# Patient Record
Sex: Female | Born: 1935 | Race: White | Hispanic: No | Smoking: Former smoker
Health system: Southern US, Community
[De-identification: ages and names within clinical notes are randomized; demographics above are authoritative.]

## PROBLEM LIST (undated history)

## (undated) DIAGNOSIS — M810 Age-related osteoporosis without current pathological fracture: Secondary | ICD-10-CM

## (undated) DIAGNOSIS — Z23 Encounter for immunization: Secondary | ICD-10-CM

## (undated) DIAGNOSIS — F0390 Unspecified dementia without behavioral disturbance: Secondary | ICD-10-CM

## (undated) DIAGNOSIS — K219 Gastro-esophageal reflux disease without esophagitis: Secondary | ICD-10-CM

## (undated) DIAGNOSIS — E559 Vitamin D deficiency, unspecified: Secondary | ICD-10-CM

## (undated) DIAGNOSIS — F329 Major depressive disorder, single episode, unspecified: Secondary | ICD-10-CM

## (undated) DIAGNOSIS — C50919 Malignant neoplasm of unspecified site of unspecified female breast: Secondary | ICD-10-CM

## (undated) DIAGNOSIS — F32A Depression, unspecified: Secondary | ICD-10-CM

## (undated) DIAGNOSIS — F419 Anxiety disorder, unspecified: Secondary | ICD-10-CM

## (undated) DIAGNOSIS — J189 Pneumonia, unspecified organism: Secondary | ICD-10-CM

## (undated) DIAGNOSIS — E7800 Pure hypercholesterolemia, unspecified: Secondary | ICD-10-CM

## (undated) DIAGNOSIS — I10 Essential (primary) hypertension: Secondary | ICD-10-CM

## (undated) DIAGNOSIS — K449 Diaphragmatic hernia without obstruction or gangrene: Secondary | ICD-10-CM

## (undated) DIAGNOSIS — J449 Chronic obstructive pulmonary disease, unspecified: Secondary | ICD-10-CM

## (undated) DIAGNOSIS — R7303 Prediabetes: Secondary | ICD-10-CM

## (undated) HISTORY — PX: OTHER SURGICAL HISTORY: SHX169

## (undated) HISTORY — DX: Encounter for immunization: Z23

## (undated) HISTORY — DX: Chronic obstructive pulmonary disease, unspecified: J44.9

## (undated) HISTORY — DX: Gastro-esophageal reflux disease without esophagitis: K21.9

## (undated) HISTORY — DX: Depression, unspecified: F32.A

## (undated) HISTORY — DX: Age-related osteoporosis without current pathological fracture: M81.0

## (undated) HISTORY — DX: Malignant neoplasm of unspecified site of unspecified female breast: C50.919

## (undated) HISTORY — PX: REPAIR ZENKER'S DIVERTICULA: SUR1212

## (undated) HISTORY — DX: Pneumonia, unspecified organism: J18.9

## (undated) HISTORY — DX: Vitamin D deficiency, unspecified: E55.9

## (undated) HISTORY — DX: Diaphragmatic hernia without obstruction or gangrene: K44.9

## (undated) HISTORY — PX: THROAT SURGERY: SHX803

## (undated) HISTORY — PX: APPENDECTOMY: SHX54

## (undated) HISTORY — DX: Essential (primary) hypertension: I10

## (undated) HISTORY — DX: Pure hypercholesterolemia, unspecified: E78.00

## (undated) HISTORY — DX: Major depressive disorder, single episode, unspecified: F32.9

## (undated) HISTORY — DX: Prediabetes: R73.03

## (undated) HISTORY — DX: Anxiety disorder, unspecified: F41.9

---

## 1998-01-01 ENCOUNTER — Other Ambulatory Visit: Admission: RE | Admit: 1998-01-01 | Discharge: 1998-01-01 | Payer: Self-pay | Admitting: *Deleted

## 1998-04-12 ENCOUNTER — Other Ambulatory Visit: Admission: RE | Admit: 1998-04-12 | Discharge: 1998-04-12 | Payer: Self-pay | Admitting: *Deleted

## 1998-07-05 ENCOUNTER — Ambulatory Visit (HOSPITAL_COMMUNITY): Admission: RE | Admit: 1998-07-05 | Discharge: 1998-07-05 | Payer: Self-pay | Admitting: *Deleted

## 1998-10-05 ENCOUNTER — Ambulatory Visit (HOSPITAL_COMMUNITY): Admission: RE | Admit: 1998-10-05 | Discharge: 1998-10-05 | Payer: Self-pay | Admitting: *Deleted

## 1998-10-22 ENCOUNTER — Encounter: Payer: Self-pay | Admitting: Internal Medicine

## 1998-10-22 ENCOUNTER — Inpatient Hospital Stay (HOSPITAL_COMMUNITY): Admission: EM | Admit: 1998-10-22 | Discharge: 1998-10-26 | Payer: Self-pay | Admitting: Internal Medicine

## 1999-03-21 ENCOUNTER — Encounter: Payer: Self-pay | Admitting: *Deleted

## 1999-03-22 ENCOUNTER — Inpatient Hospital Stay (HOSPITAL_COMMUNITY): Admission: RE | Admit: 1999-03-22 | Discharge: 1999-03-24 | Payer: Self-pay | Admitting: *Deleted

## 1999-04-17 ENCOUNTER — Encounter: Payer: Self-pay | Admitting: *Deleted

## 1999-04-17 ENCOUNTER — Ambulatory Visit (HOSPITAL_COMMUNITY): Admission: RE | Admit: 1999-04-17 | Discharge: 1999-04-17 | Payer: Self-pay | Admitting: *Deleted

## 1999-07-30 ENCOUNTER — Other Ambulatory Visit: Admission: RE | Admit: 1999-07-30 | Discharge: 1999-07-30 | Payer: Self-pay | Admitting: *Deleted

## 2000-02-07 ENCOUNTER — Ambulatory Visit (HOSPITAL_COMMUNITY): Admission: RE | Admit: 2000-02-07 | Discharge: 2000-02-07 | Payer: Self-pay | Admitting: *Deleted

## 2001-02-11 ENCOUNTER — Encounter: Payer: Self-pay | Admitting: Internal Medicine

## 2001-02-11 ENCOUNTER — Ambulatory Visit (HOSPITAL_COMMUNITY): Admission: RE | Admit: 2001-02-11 | Discharge: 2001-02-11 | Payer: Self-pay | Admitting: Internal Medicine

## 2002-02-14 ENCOUNTER — Ambulatory Visit (HOSPITAL_COMMUNITY): Admission: RE | Admit: 2002-02-14 | Discharge: 2002-02-14 | Payer: Self-pay | Admitting: Internal Medicine

## 2002-02-14 ENCOUNTER — Encounter: Payer: Self-pay | Admitting: Internal Medicine

## 2002-02-22 ENCOUNTER — Encounter: Admission: RE | Admit: 2002-02-22 | Discharge: 2002-02-22 | Payer: Self-pay | Admitting: Internal Medicine

## 2002-02-22 ENCOUNTER — Encounter (INDEPENDENT_AMBULATORY_CARE_PROVIDER_SITE_OTHER): Payer: Self-pay | Admitting: *Deleted

## 2002-02-22 ENCOUNTER — Encounter: Payer: Self-pay | Admitting: Internal Medicine

## 2002-03-07 ENCOUNTER — Encounter (INDEPENDENT_AMBULATORY_CARE_PROVIDER_SITE_OTHER): Payer: Self-pay | Admitting: *Deleted

## 2002-03-07 ENCOUNTER — Encounter: Payer: Self-pay | Admitting: Surgery

## 2002-03-07 ENCOUNTER — Ambulatory Visit (HOSPITAL_BASED_OUTPATIENT_CLINIC_OR_DEPARTMENT_OTHER): Admission: RE | Admit: 2002-03-07 | Discharge: 2002-03-07 | Payer: Self-pay | Admitting: Surgery

## 2002-03-07 ENCOUNTER — Encounter: Admission: RE | Admit: 2002-03-07 | Discharge: 2002-03-07 | Payer: Self-pay | Admitting: Surgery

## 2002-03-15 ENCOUNTER — Ambulatory Visit: Admission: RE | Admit: 2002-03-15 | Discharge: 2002-06-13 | Payer: Self-pay | Admitting: Radiation Oncology

## 2002-06-28 ENCOUNTER — Ambulatory Visit: Admission: RE | Admit: 2002-06-28 | Discharge: 2002-06-28 | Payer: Self-pay | Admitting: Radiation Oncology

## 2002-12-28 ENCOUNTER — Encounter: Payer: Self-pay | Admitting: Internal Medicine

## 2002-12-28 ENCOUNTER — Ambulatory Visit (HOSPITAL_COMMUNITY): Admission: RE | Admit: 2002-12-28 | Discharge: 2002-12-28 | Payer: Self-pay | Admitting: Internal Medicine

## 2003-02-16 ENCOUNTER — Encounter: Admission: RE | Admit: 2003-02-16 | Discharge: 2003-02-16 | Payer: Self-pay | Admitting: Internal Medicine

## 2003-02-16 ENCOUNTER — Encounter: Payer: Self-pay | Admitting: Internal Medicine

## 2003-06-13 ENCOUNTER — Encounter: Payer: Self-pay | Admitting: Internal Medicine

## 2003-06-13 ENCOUNTER — Ambulatory Visit (HOSPITAL_COMMUNITY): Admission: RE | Admit: 2003-06-13 | Discharge: 2003-06-13 | Payer: Self-pay | Admitting: Internal Medicine

## 2003-07-28 ENCOUNTER — Ambulatory Visit (HOSPITAL_COMMUNITY): Admission: RE | Admit: 2003-07-28 | Discharge: 2003-07-28 | Payer: Self-pay | Admitting: Internal Medicine

## 2003-09-09 LAB — HM DEXA SCAN

## 2003-10-10 LAB — HM COLONOSCOPY: HM Colonoscopy: NEGATIVE

## 2004-02-19 ENCOUNTER — Encounter: Admission: RE | Admit: 2004-02-19 | Discharge: 2004-02-19 | Payer: Self-pay | Admitting: Oncology

## 2005-02-19 ENCOUNTER — Encounter: Admission: RE | Admit: 2005-02-19 | Discharge: 2005-02-19 | Payer: Self-pay | Admitting: Oncology

## 2005-03-27 ENCOUNTER — Ambulatory Visit (HOSPITAL_COMMUNITY): Admission: RE | Admit: 2005-03-27 | Discharge: 2005-03-27 | Payer: Self-pay | Admitting: Internal Medicine

## 2005-04-10 ENCOUNTER — Ambulatory Visit (HOSPITAL_COMMUNITY): Admission: RE | Admit: 2005-04-10 | Discharge: 2005-04-10 | Payer: Self-pay | Admitting: Internal Medicine

## 2005-06-24 ENCOUNTER — Ambulatory Visit: Payer: Self-pay | Admitting: Oncology

## 2006-02-20 ENCOUNTER — Encounter: Admission: RE | Admit: 2006-02-20 | Discharge: 2006-02-20 | Payer: Self-pay | Admitting: Oncology

## 2006-06-08 ENCOUNTER — Ambulatory Visit: Payer: Self-pay | Admitting: Oncology

## 2006-06-09 LAB — CBC WITH DIFFERENTIAL/PLATELET
BASO%: 0.5 % (ref 0.0–2.0)
Eosinophils Absolute: 0.3 10*3/uL (ref 0.0–0.5)
MONO#: 0.5 10*3/uL (ref 0.1–0.9)
NEUT#: 4.4 10*3/uL (ref 1.5–6.5)
RBC: 4.09 10*6/uL (ref 3.70–5.32)
RDW: 12.3 % (ref 11.3–14.5)
WBC: 7.4 10*3/uL (ref 3.9–10.0)
lymph#: 2.2 10*3/uL (ref 0.9–3.3)

## 2007-01-25 ENCOUNTER — Inpatient Hospital Stay (HOSPITAL_COMMUNITY): Admission: EM | Admit: 2007-01-25 | Discharge: 2007-01-30 | Payer: Self-pay | Admitting: *Deleted

## 2007-02-08 ENCOUNTER — Ambulatory Visit (HOSPITAL_COMMUNITY): Admission: RE | Admit: 2007-02-08 | Discharge: 2007-02-08 | Payer: Self-pay | Admitting: Internal Medicine

## 2007-03-03 ENCOUNTER — Encounter: Admission: RE | Admit: 2007-03-03 | Discharge: 2007-03-03 | Payer: Self-pay | Admitting: Oncology

## 2007-03-19 ENCOUNTER — Ambulatory Visit: Payer: Self-pay | Admitting: Internal Medicine

## 2007-04-13 ENCOUNTER — Encounter: Payer: Self-pay | Admitting: Family Medicine

## 2007-04-30 ENCOUNTER — Ambulatory Visit: Payer: Self-pay | Admitting: Internal Medicine

## 2007-06-16 ENCOUNTER — Ambulatory Visit: Payer: Self-pay | Admitting: Oncology

## 2007-06-21 LAB — CBC WITH DIFFERENTIAL/PLATELET
Eosinophils Absolute: 0.5 10*3/uL (ref 0.0–0.5)
HCT: 35.3 % (ref 34.8–46.6)
LYMPH%: 39.8 % (ref 14.0–48.0)
MONO#: 0.5 10*3/uL (ref 0.1–0.9)
NEUT#: 4 10*3/uL (ref 1.5–6.5)
NEUT%: 47.5 % (ref 39.6–76.8)
Platelets: 353 10*3/uL (ref 145–400)
WBC: 8.3 10*3/uL (ref 3.9–10.0)
lymph#: 3.3 10*3/uL (ref 0.9–3.3)

## 2007-07-02 ENCOUNTER — Ambulatory Visit: Payer: Self-pay

## 2008-03-03 ENCOUNTER — Encounter: Admission: RE | Admit: 2008-03-03 | Discharge: 2008-03-03 | Payer: Self-pay | Admitting: Internal Medicine

## 2008-08-30 ENCOUNTER — Encounter: Admission: RE | Admit: 2008-08-30 | Discharge: 2008-08-30 | Payer: Self-pay | Admitting: Internal Medicine

## 2008-12-05 IMAGING — CR DG CHEST 2V
2 series · 2 of 2 positions shown · non-contrast
Comparison: 01/25/07.

CLINICAL DATA: Cough.  Shortness of breath.  Asthma.  Recent pneumonia.
 CHEST - 2 VIEW:

[view not recorded (1 of 2)]
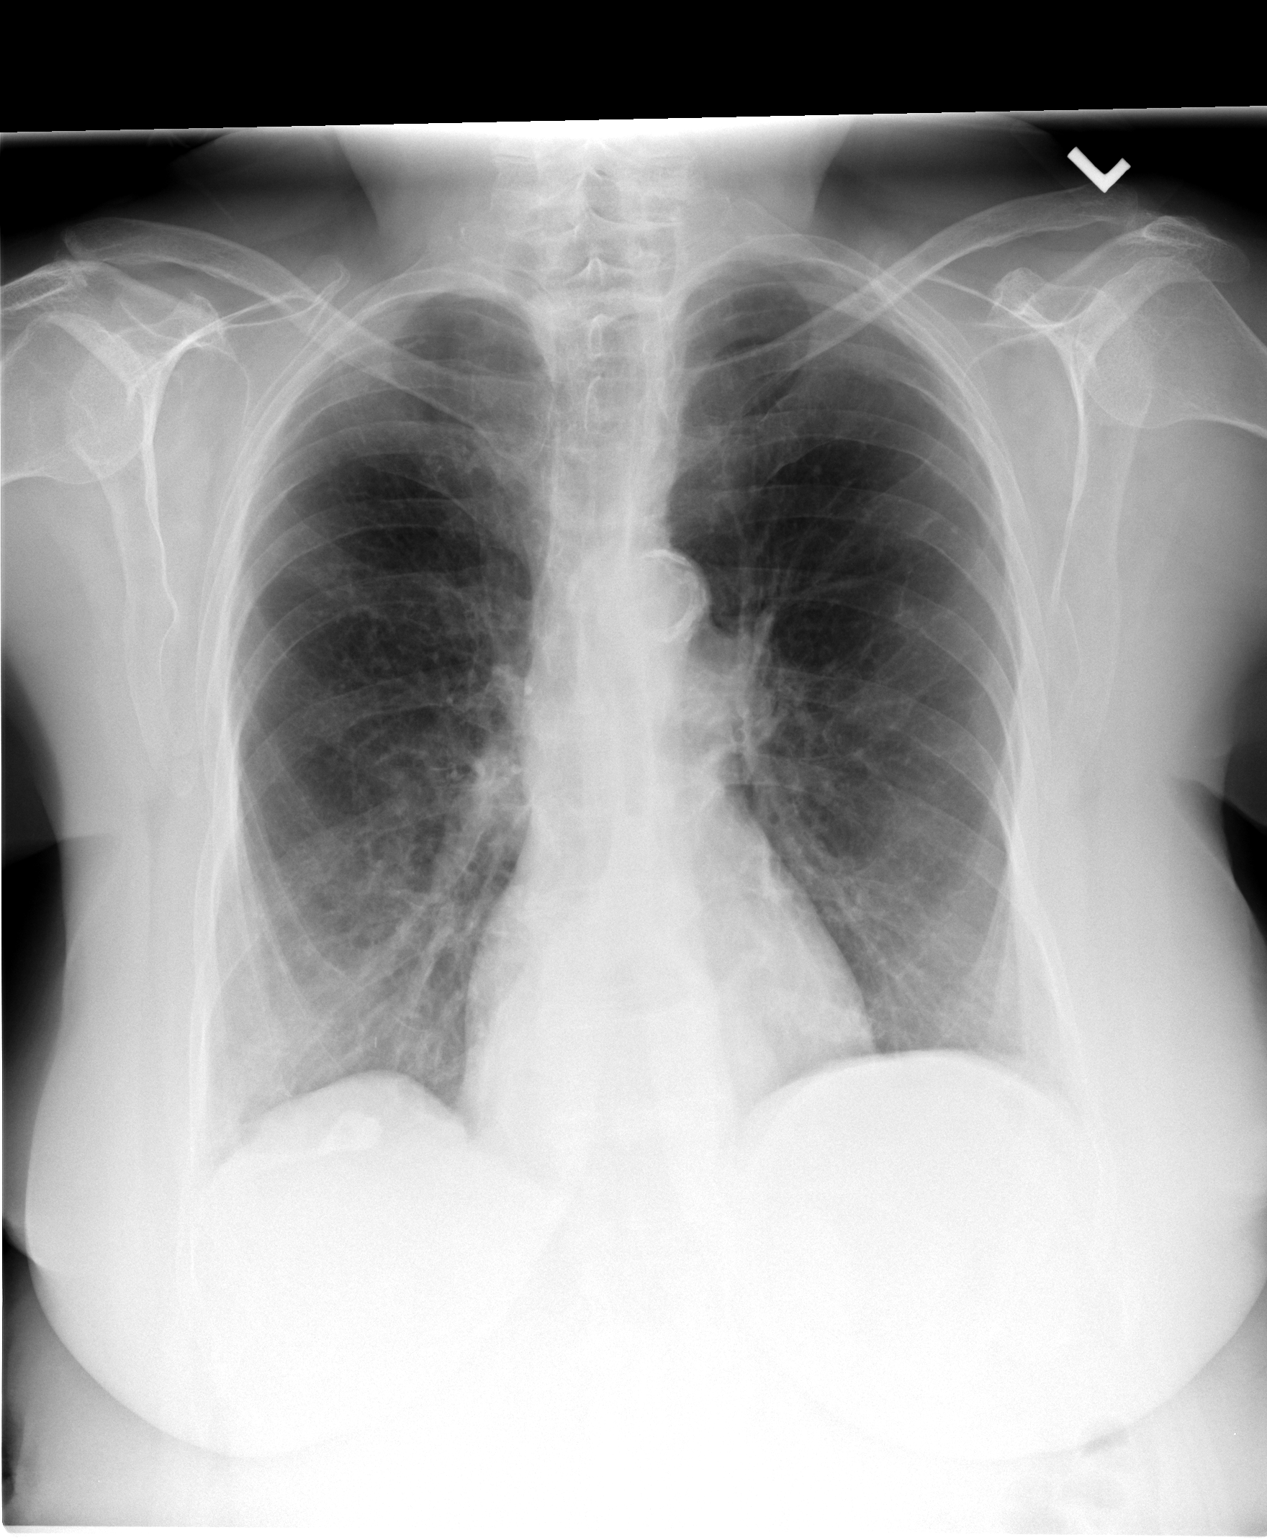

[view not recorded (2 of 2)]
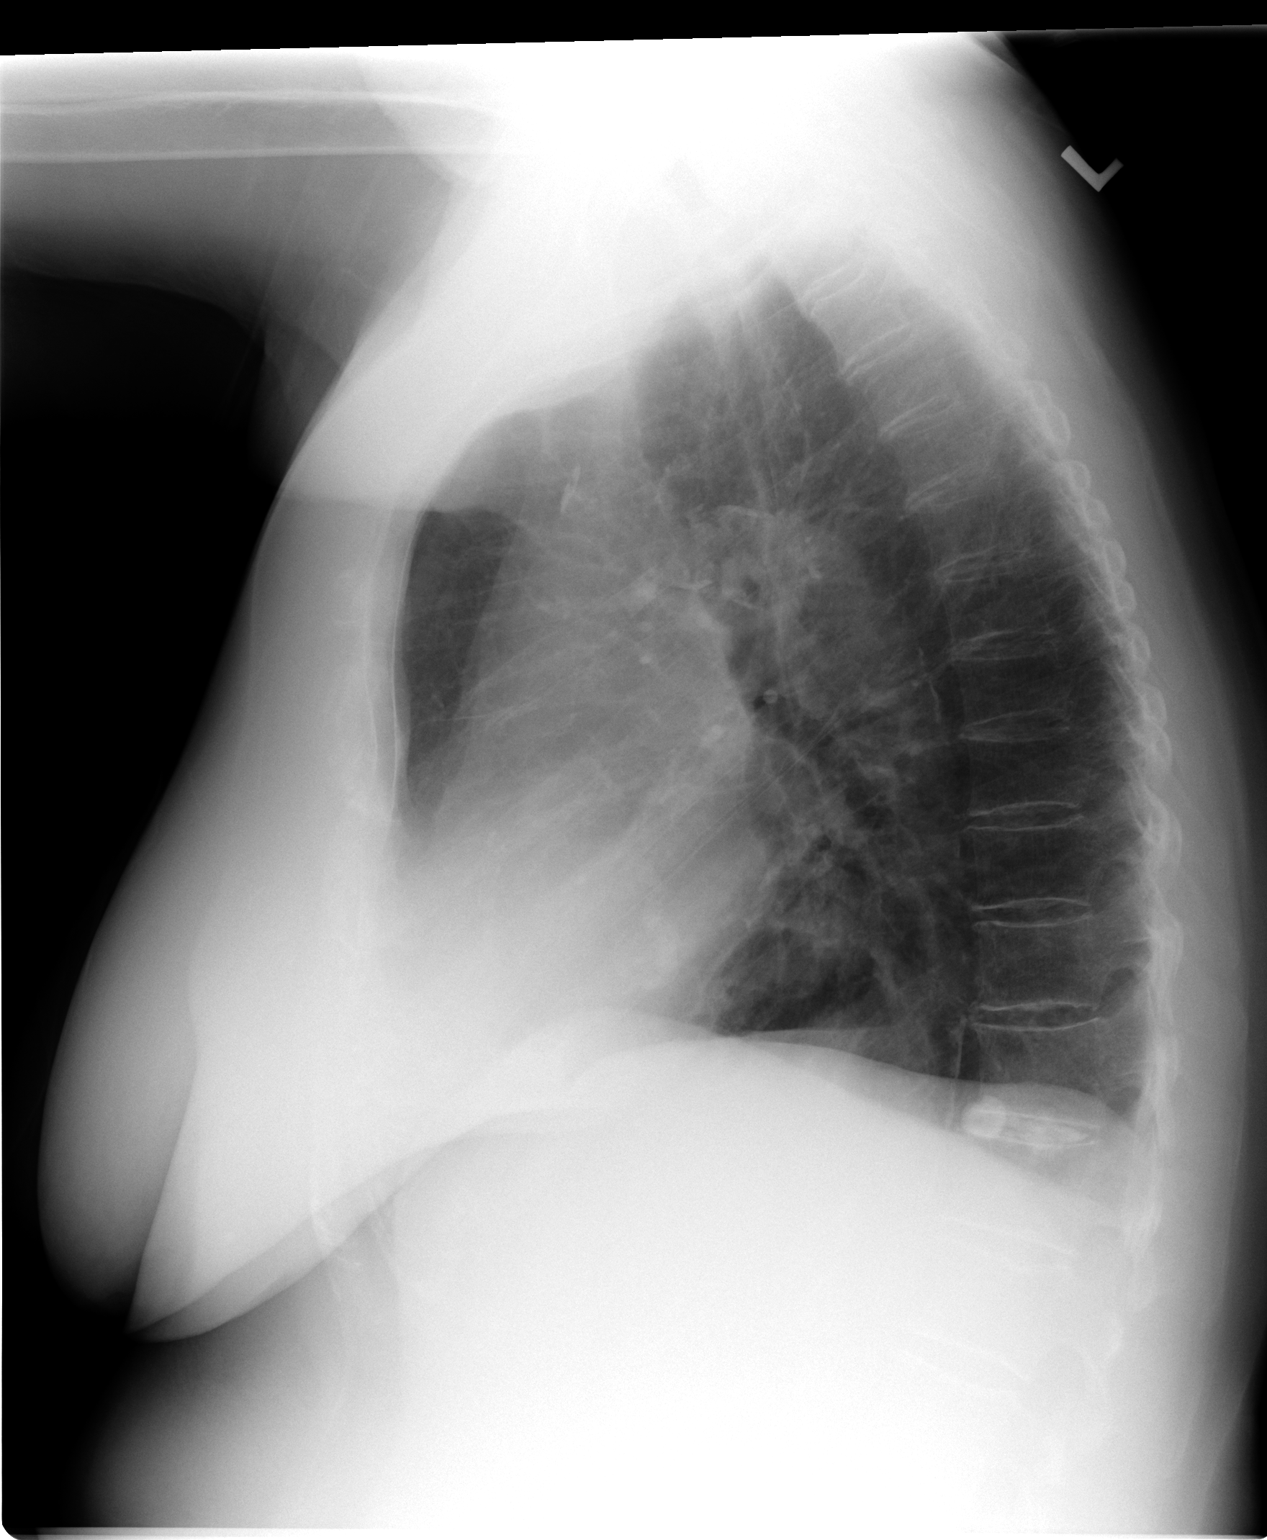

[2 of 2 positions shown; findings below may reference images not displayed]

FINDINGS: There has been near complete resolution of right lower lobe air space disease since the prior study.  There is no evidence of pleural effusion.  The heart size is normal.   A small to moderate hiatal hernia is again noted.
IMPRESSION: 1.  Near complete resolution of right lower lobe infiltrate since the prior study.  
 2.  Stable small to moderate hiatal hernia.

## 2009-05-28 ENCOUNTER — Encounter: Admission: RE | Admit: 2009-05-28 | Discharge: 2009-05-28 | Payer: Self-pay | Admitting: Internal Medicine

## 2009-09-24 ENCOUNTER — Ambulatory Visit (HOSPITAL_COMMUNITY): Admission: RE | Admit: 2009-09-24 | Discharge: 2009-09-24 | Payer: Self-pay | Admitting: Internal Medicine

## 2009-12-28 ENCOUNTER — Inpatient Hospital Stay (HOSPITAL_COMMUNITY): Admission: EM | Admit: 2009-12-28 | Discharge: 2009-12-31 | Payer: Self-pay | Admitting: Emergency Medicine

## 2009-12-31 ENCOUNTER — Encounter (INDEPENDENT_AMBULATORY_CARE_PROVIDER_SITE_OTHER): Payer: Self-pay | Admitting: Internal Medicine

## 2010-05-17 ENCOUNTER — Encounter: Admission: RE | Admit: 2010-05-17 | Discharge: 2010-05-17 | Payer: Self-pay | Admitting: Internal Medicine

## 2010-05-24 ENCOUNTER — Encounter: Admission: RE | Admit: 2010-05-24 | Discharge: 2010-05-24 | Payer: Self-pay | Admitting: Internal Medicine

## 2010-10-20 ENCOUNTER — Encounter: Payer: Self-pay | Admitting: Oncology

## 2010-11-04 ENCOUNTER — Other Ambulatory Visit: Payer: Self-pay | Admitting: Internal Medicine

## 2010-11-04 DIAGNOSIS — Z09 Encounter for follow-up examination after completed treatment for conditions other than malignant neoplasm: Secondary | ICD-10-CM

## 2010-11-13 ENCOUNTER — Ambulatory Visit
Admission: RE | Admit: 2010-11-13 | Discharge: 2010-11-13 | Disposition: A | Discharge status: Home / Self Care | Payer: Self-pay | Source: Ambulatory Visit | Attending: Internal Medicine | Admitting: Internal Medicine

## 2010-11-13 DIAGNOSIS — Z09 Encounter for follow-up examination after completed treatment for conditions other than malignant neoplasm: Secondary | ICD-10-CM

## 2010-11-17 ENCOUNTER — Inpatient Hospital Stay (HOSPITAL_COMMUNITY)
Admission: EM | Admit: 2010-11-17 | Discharge: 2010-11-19 | DRG: 190 | Disposition: A | Discharge status: Home / Self Care | Payer: Medicare Other | Attending: Internal Medicine | Admitting: Internal Medicine

## 2010-11-17 ENCOUNTER — Encounter: Payer: Self-pay | Admitting: Cardiology

## 2010-11-17 ENCOUNTER — Emergency Department (HOSPITAL_COMMUNITY): Payer: Medicare Other

## 2010-11-17 DIAGNOSIS — J441 Chronic obstructive pulmonary disease with (acute) exacerbation: Principal | ICD-10-CM | POA: Diagnosis present

## 2010-11-17 DIAGNOSIS — M810 Age-related osteoporosis without current pathological fracture: Secondary | ICD-10-CM | POA: Diagnosis present

## 2010-11-17 DIAGNOSIS — J189 Pneumonia, unspecified organism: Secondary | ICD-10-CM | POA: Diagnosis present

## 2010-11-17 DIAGNOSIS — I10 Essential (primary) hypertension: Secondary | ICD-10-CM | POA: Diagnosis present

## 2010-11-17 DIAGNOSIS — Z7982 Long term (current) use of aspirin: Secondary | ICD-10-CM

## 2010-11-17 DIAGNOSIS — Z853 Personal history of malignant neoplasm of breast: Secondary | ICD-10-CM

## 2010-11-17 DIAGNOSIS — IMO0002 Reserved for concepts with insufficient information to code with codable children: Secondary | ICD-10-CM

## 2010-11-17 DIAGNOSIS — Z9010 Acquired absence of unspecified breast and nipple: Secondary | ICD-10-CM

## 2010-11-17 DIAGNOSIS — J45901 Unspecified asthma with (acute) exacerbation: Principal | ICD-10-CM | POA: Diagnosis present

## 2010-11-17 DIAGNOSIS — E7800 Pure hypercholesterolemia, unspecified: Secondary | ICD-10-CM | POA: Diagnosis present

## 2010-11-17 DIAGNOSIS — K219 Gastro-esophageal reflux disease without esophagitis: Secondary | ICD-10-CM | POA: Diagnosis present

## 2010-11-17 DIAGNOSIS — Z87891 Personal history of nicotine dependence: Secondary | ICD-10-CM

## 2010-11-17 DIAGNOSIS — Z79899 Other long term (current) drug therapy: Secondary | ICD-10-CM

## 2010-11-17 DIAGNOSIS — E785 Hyperlipidemia, unspecified: Secondary | ICD-10-CM | POA: Diagnosis present

## 2010-11-17 LAB — CBC
HCT: 35.8 % — ABNORMAL LOW (ref 36.0–46.0)
Hemoglobin: 11.7 g/dL — ABNORMAL LOW (ref 12.0–15.0)
MCH: 27.5 pg (ref 26.0–34.0)
MCHC: 32.7 g/dL (ref 30.0–36.0)
MCV: 84.2 fL (ref 78.0–100.0)
RDW: 14 % (ref 11.5–15.5)

## 2010-11-17 LAB — PROTIME-INR
INR: 0.91 (ref 0.00–1.49)
Prothrombin Time: 12.5 seconds (ref 11.6–15.2)

## 2010-11-17 LAB — BASIC METABOLIC PANEL
CO2: 26 mEq/L (ref 19–32)
Calcium: 9 mg/dL (ref 8.4–10.5)
Creatinine, Ser: 0.93 mg/dL (ref 0.4–1.2)
GFR calc Af Amer: >60 mL/min (ref >60)
GFR calc non Af Amer: 57 mL/min — ABNORMAL LOW (ref >60)

## 2010-11-17 LAB — DIFFERENTIAL
Eosinophils Relative: 4 % (ref 0–5)
Lymphocytes Relative: 46 % (ref 12–46)
Lymphs Abs: 2.9 10*3/uL (ref 0.7–4.0)
Monocytes Absolute: 0.5 10*3/uL (ref 0.1–1.0)
Monocytes Relative: 8 % (ref 3–12)
Neutro Abs: 2.7 10*3/uL (ref 1.7–7.7)

## 2010-11-17 LAB — CARDIAC PANEL(CRET KIN+CKTOT+MB+TROPI)
CK, MB: 1.6 ng/mL (ref 0.3–4.0)
Troponin I: <0.01 ng/mL (ref 0.00–0.06)

## 2010-11-17 LAB — D-DIMER, QUANTITATIVE: D-Dimer, Quant: 1.12 ug/mL-FEU — ABNORMAL HIGH (ref 0.00–0.48)

## 2010-11-17 LAB — APTT: aPTT: 28 seconds (ref 24–37)

## 2010-11-17 LAB — CK TOTAL AND CKMB (NOT AT ARMC): Relative Index: INVALID (ref 0.0–2.5)

## 2010-11-17 LAB — POCT CARDIAC MARKERS: Myoglobin, poc: 93.5 ng/mL (ref 12–200)

## 2010-11-17 MED ORDER — IOHEXOL 300 MG/ML  SOLN: 80.0000 mL | Freq: Once | INTRAMUSCULAR | Status: AC | PRN

## 2010-11-18 ENCOUNTER — Encounter: Payer: Self-pay | Admitting: Cardiology

## 2010-11-18 LAB — VITAMIN B12: Vitamin B-12: 228 pg/mL (ref 211–911)

## 2010-11-18 LAB — IRON AND TIBC
Iron: 20 ug/dL — ABNORMAL LOW (ref 42–135)
TIBC: 340 ug/dL (ref 250–470)
UIBC: 320 ug/dL

## 2010-11-18 LAB — FERRITIN: Ferritin: 25 ng/mL (ref 10–291)

## 2010-11-19 LAB — CBC
Hemoglobin: 10.5 g/dL — ABNORMAL LOW (ref 12.0–15.0)
MCH: 27.7 pg (ref 26.0–34.0)
RBC: 3.79 MIL/uL — ABNORMAL LOW (ref 3.87–5.11)

## 2010-11-19 LAB — BASIC METABOLIC PANEL
CO2: 25 mEq/L (ref 19–32)
Chloride: 101 mEq/L (ref 96–112)
GFR calc Af Amer: >60 mL/min (ref >60)
Sodium: 139 mEq/L (ref 135–145)

## 2010-11-26 NOTE — H&P (Signed)
[name], [name]           ACCOUNT NO.:  [id number]  MEDICAL RECORD NO.:  [id number]           PATIENT TYPE:  I  LOCATION:  4711                         FACILITY:  [facility]  PHYSICIAN:  Gordy Savers, MDDATE OF BIRTH:  11-May-1925  DATE OF ADMISSION:  11/17/2010 DATE OF DISCHARGE:                             HISTORY & PHYSICAL   CHIEF COMPLAINT:  Cough and rapid pulse rate.  HISTORY OF PRESENT ILLNESS:  The patient is an 77 year old female who has a history of COPD.  She has been ill for the past 3 weeks with refractory cough.  She was initially evaluated by her primary care provider 13 days ago and treated with azithromycin.  She has failed to improve with persistent chest congestion and cough.  She has obtained a home nebulizer treatment and has been receiving albuterol treatments at home.  Two days ago antibiotics were changed to Avelox 400 mg daily. Today, the patient developed a rapid forceful heart rate and presented to the ED for evaluation.  She denied any chest pain but has had some worsening cough and shortness of breath.  On arrival to the hospital, she was noted in pulse rate of 114 and oxygen saturation 92%. Evaluation also included laboratory studies that revealed a normal white count of 6.3.  Cardiac enzymes were negative.  D-dimer was elevated at 1.12.  A subsequent chest CTA was negative for acute pulmonary emboli. Chest x-ray revealed some bibasilar atelectatic changes and no active disease.  The patient is now admitted for further evaluation and treatment of exacerbation of her COPD.  PAST MEDICAL HISTORY:  Medical problems include COPD and a history of asthma.  Additionally she has hypercholesterolemia, remote history of breast cancer status post right mastectomy, she has hypertension, osteoporosis, and a history of gastroesophageal reflux disease.  PRESENT MEDICAL REGIMEN: 1. Avelox 400 mg daily. 2. Cardizem CD 180 mg daily. 3. Atrovent  handheld nebulizer. 4. Nexium 40 mg daily. 5. Singulair 10 mg daily. 6. Aspirin 81 mg daily.  SOCIAL HISTORY:  She is widowed.  She has a son and daughter in the [place] area.  She discontinued tobacco use 25 years ago.  SURGICAL PROCEDURES:  A mastectomy and an appendectomy.  ALLERGIES:  She has no known drug allergies except for ANTIBIOTIC that she does not recall.  REVIEW OF SYSTEMS:  GENERAL:  Negative for any weight loss, recent fever or chills.  HEENT: No history of visual loss, hearing loss, or swallowing difficulties.  PULMONARY:  See history of present illness. CARDIAC:  Denies any history of cardiac disease, has had some rapid regular palpitations that prompted to the ED visit today.  No history of heart failure or other history of tachyarrhythmias.  GI:  No history of nausea, vomiting, or diarrhea.  GENITOURINARY:  She was last hospitalized in June 2011 for an acute UTI.  No dysuria or frequency. NEURO/PSYCH:  No history of anxiety or depression.  PHYSICAL EXAMINATION:  GENERAL:  A well-developed, thin white female in no acute distress. VITAL SIGNS:  Nasal oxygen in place.  Temperature of 97.9, blood pressure 120/65, pulse rate 87, respiratory rate 18, and O2 saturation 99%  on supplemental oxygen. SKIN:  Warm and dry without rash. HEENT:  Normal pupil responses.  Conjunctiva clear.  ENT unremarkable. Dentures were in place.  Mucous membranes appeared pink and well hydrated. NECK:  No bruits, neck vein distention, or adenopathy. CHEST:  Increased AP diameter and generally diminished breath sounds and few coarse rhonchi were noted bilaterally, a few basilar rales were noted on the left. CARDIOVASCULAR:  Normal S1 and S2, no murmur.  Rhythm was regular with a rate of 80-85. ABDOMEN:  Soft and nontender.  No organomegaly.  No masses.  There is no guarding or distention. BREASTS:  Prior right mastectomy. EXTREMITIES:  No edema.  Pedal pulses revealed full dorsalis  pedis pulses.  Posterior tibial pulses were faint.  No clubbing or cyanosis or edema. NEURO:  Nonfocal.  LABORATORY STUDIES:  Chest x-ray revealed some bibasilar atelectatic changes only without active disease.  White count 6.3.  Initial set of cardiac enzymes normal.  IMPRESSION:  Acute exacerbation of coronary artery occlusive disease.  ADDITIONAL DIAGNOSES:  Hypercholesterolemia, hypertension, osteoporosis, and remote breast cancer.  DISPOSITION:  The patient will be admitted to a telemetry setting.  She will be maintained on Avelox.  She will be treated with pulmonary toilet to include expectorants and nebulizer treatments.  Cardiac enzymes will be monitored and followup EKG will be checked in the morning.  There is some concern about a change in her EKG compared to prior tracings.     Gordy Savers, MD     PFK/MEDQ  D:  11/17/2010  T:  11/18/2010  Job:  [id number]  Electronically Signed by Eleonore Chiquito MD on 11/26/2010 12:27:32 PM

## 2010-11-27 NOTE — Discharge Summary (Signed)
[name], [name]           ACCOUNT NO.:  [id number]  MEDICAL RECORD NO.:  [id number]           PATIENT TYPE:  I  LOCATION:  4711                         FACILITY:  [facility]  PHYSICIAN:  Peggye Pitt, M.D. DATE OF BIRTH:  13-Nov-1924  DATE OF ADMISSION:  11/17/2010 DATE OF DISCHARGE:  11/19/2010                              DISCHARGE SUMMARY   PRIMARY CARE PHYSICIAN:  Carolyn Kim, DO.  DISCHARGE DIAGNOSES: 1. Bronchitis/pneumonia. 2. Chronic obstructive pulmonary disease with acute exacerbation. 3. Asthma. 4. Hyperlipidemia. 5. Remote history of breast cancer status post right mastectomy. 6. Hypertension. 7. Gastroesophageal reflux disease. 8. Osteoporosis.  DISCHARGE MEDICATIONS:  Include: 1. Albuterol inhaler 2 puffs every 4 hours as needed for shortness of     breath. 2. Aspirin 81 mg daily. 3. Mucinex 600 mg twice daily as needed for congestion. 4. Avelox 400 mg daily for 7 days. 5. Prednisone taper 10 mg tablets to take 6 tablets for 2 days, 5     tablets for 2 days, 4 tablets for 2 days, 3 tablets for 2 days, 2     tablets for 2 days, 1 tablet for 2 days, half a tablet for 2 days,     and then discontinue. 6. Advair Diskus 250/50 1 puff twice daily. 7. Diltiazem CD 180 mg daily. 8. Nexium 40 mg daily. 9. Singulair 10 mg daily.  DISPOSITION AND FOLLOWUP:  [name] will be discharged home today in stable and improved condition.  She is instructed to follow up with her PCP for hospital followup in 2 weeks.  CONSULTATION AT THIS HOSPITALIZATION:  None.  IMAGES AND PROCEDURES:  Include: 1. A chest x-ray on February 19 that showed mild basilar atelectasis     with no definite acute process and a moderate sized hiatal hernia. 2. A CT scan of the chest on February 19 that showed no CT evidence     for acute PE.  Tree-in-bud type nodular opacities and bronchial     wall thickening of the right lower lobe compatible with small     airways infectious  etiology.  HISTORY AND PHYSICAL:  For details, please see dictation by Dr. Amador Cunas on February 19; but in brief, [name] is an 77 year old Caucasian lady with history of COPD, asthma, and hypertension, who has been feeling sick for the past 3 weeks with a refractory cough, has been treated by her PCP with azithromycin.  However, failed to resolve and was resistant to oral Avelox.  Because of continued cough and shortness of breath, they came into the hospital for evaluation.  HOSPITAL COURSE BY PROBLEM:  Cough and shortness of breath.  She is found to have a bronchitis/pneumonia in CT scan.  We believe that Avelox is an appropriate treatment for this.  She has also been started on steroid while sent her home on a prednisone taper.  She is feeling much improved on day of discharge and is in fact wanting to go home today. She is instructed to continue her Advair.  I have also given her prescription for an albuterol inhaler as well as steroids.  She is instructed to follow up  with her PCP in 2 weeks or sooner as needed.Thus, all the rest of her chronic conditions have been stable.  Vitals on day of discharge, blood pressure 144/72, heart rate 95, respirations 18, sats 94% on room air, and temperature 97.3.     Peggye Pitt, M.D.     EH/MEDQ  D:  11/19/2010  T:  11/20/2010  Job:  [id number]  cc:   [name], D.O.  Electronically Signed by Peggye Pitt M.D. on 11/27/2010 08:30:45 AM

## 2010-11-29 ENCOUNTER — Encounter: Payer: Self-pay | Admitting: Cardiology

## 2010-11-29 ENCOUNTER — Ambulatory Visit (INDEPENDENT_AMBULATORY_CARE_PROVIDER_SITE_OTHER): Payer: Medicare Other | Admitting: Cardiology

## 2010-11-29 DIAGNOSIS — R0602 Shortness of breath: Secondary | ICD-10-CM

## 2010-11-29 DIAGNOSIS — J438 Other emphysema: Secondary | ICD-10-CM | POA: Insufficient documentation

## 2010-11-29 DIAGNOSIS — J45909 Unspecified asthma, uncomplicated: Secondary | ICD-10-CM | POA: Insufficient documentation

## 2010-11-29 DIAGNOSIS — I10 Essential (primary) hypertension: Secondary | ICD-10-CM | POA: Insufficient documentation

## 2010-12-02 ENCOUNTER — Telehealth (INDEPENDENT_AMBULATORY_CARE_PROVIDER_SITE_OTHER): Payer: Self-pay | Admitting: Radiology

## 2010-12-05 NOTE — Assessment & Plan Note (Signed)
Summary: np6/abn ekg,cath?/medicare/dr. Elisabeth Most per rebecca [phone]   Visit Type:  Initial Consult Primary Provider:  Lovenia Kim, DO  CC:  Fatigue and Palpitations.  History of Present Illness: The patient presents for evaluation of the above. She reports about 3 weeks ago an episode of nausea vomiting and weakness. She was told she might have an ear infection. She continued to have increasing weakness. A couple of days following the onset of this she had at least one episode of feeling like her heart was racing away.. This was all new. She was admitted to the hospital for a few days and I reviewed all of these records are in she ruled out for myocardial infarction. Was no evidence of pulmonary embolism. Echocardiogram demonstrated no significant wall motion abnormalities. There may have been some aortic valve sclerosis noted on her previous echo but no significant valvular abnormalities. I did review her EKGs from the day of admission and 2 days later at discharge. There was some ST depression initially with LVH when she had more rapid rate. However, this was resolved and she was not felt to have had an acute ischemic event. She is actually not describing chest pressure, neck or arm discomfort. She's had no further palpitations which he feels weak.  Current Medications (verified): 1)  Alprazolam 0.5 Mg Tabs (Alprazolam) .... As Needed 2)  Vitamin D-3 5000 Unit Tabs (Cholecalciferol) .Marland Kitchen.. 1 By Mouth Daily 3)  Singulair 10 Mg Tabs (Montelukast Sodium) .Marland Kitchen.. 1 By Mouth Daily 4)  Nexium 40 Mg Cpdr (Esomeprazole Magnesium) .Marland Kitchen.. 1 By Mouth Daily 5)  Prednisone 10 Mg Tabs (Prednisone) .... Uad 6)  Aspirin 81 Mg  Tabs (Aspirin) .Marland Kitchen.. 1 By Mouth Daily 7)  Amitiza 24 Mcg Caps (Lubiprostone) .Marland Kitchen.. 1 By Mouth Daily 8)  Advair Diskus 250-50 Mcg/dose Aepb (Fluticasone-Salmeterol) .... Uad 9)  Diltiazem Hcl Coated Beads 180 Mg Xr24h-Cap (Diltiazem Hcl Coated Beads) .Marland Kitchen.. 1 By Mouth Daily 10)  Mucinex  600 Mg Xr12h-Tab (Guaifenesin) .... Uad 11)  Integra Plus  Caps (Fefum-Fepoly-Fa-B Cmp-C-Biot) .Marland Kitchen.. 1 P Odaily 12)  Proair Hfa 108 (90 Base) Mcg/act Aers (Albuterol Sulfate) .... Uad 13)  Ipratropium Bromide 0.02 % Soln (Ipratropium Bromide) .... Uad  Allergies (verified): No Known Drug Allergies  Past History:  Past Medical History: 1. Multiple pneumonias. 2. Pneumococcal vaccine several times. 3. No allergic rhinitis. 4. Hiatal hernia with reflux. 5. Hypertension. 6. Asthma. 7. Emphysema. 8. Elevated cholesterol. 9. Breast cancer with lumpectomy and radiation. 10. No tuberculosis exposure.   Past Surgical History: Breast cancer with lumpectomy and radiation. Appendectomy "Throat"surgery  Family History: Her mother died at 57 with "blood clots". Her father died at a young age with motor vehicle accident. A brother had an MI in his 88s.  Review of Systems       Positive for dizziness, cough, wheezing, constipation, joint pains. Otherwise as stated in the history of present illness negative for all other systems.  Vital Signs:  Patient profile:   77 year old female Height:      62 inches Weight:      137 pounds BMI:     25.15 Pulse rate:   86 / minute Pulse (ortho):   101 / minute Resp:     16 per minute BP sitting:   142 / 82  (right arm) BP standing:   102 / 71  Vitals Entered By: Marrion Coy, CNA (November 29, 2010 2:59 PM)  Serial Vital Signs/Assessments:  Time  Position  BP       Pulse  Resp  Temp     By           Lying LA  136/64   78                    Marrion Coy, CNA           Sitting   140/74   [address], CNA           Standing  102/71   101                   [address], CNA 3 Min     Standing  132/69   94                    [address] CNA 5 MIn     Standing  124/76   95                    [employer], CNA  Comments: Lying- No complaints Sitting- Dizzy Standing- Dizzy By: Marrion Coy, CNA  3 Min Standing- No  complaints By: Marrion Coy, CNA  5 MIn Standing 5 Min- No complaints By: Marrion Coy, CNA    Physical Exam  General:  Frail appearing in no distress Head:  normocephalic and atraumatic Eyes:  PERRLA/EOM intact; conjunctiva and lids normal. Mouth:  Teeth, gums and palate normal. Oral mucosa normal. Neck:  Neck supple, no JVD. No masses, thyromegaly or abnormal cervical nodes. Chest Wall:  no deformities or breast masses noted Lungs:  Clear bilaterally to auscultation and percussion. Abdomen:  Bowel sounds positive; abdomen soft and non-tender without masses, organomegaly, or hernias noted. No hepatosplenomegaly. Msk:  Diffuse muscle wasting normal for age Extremities:  No clubbing or cyanosis. Neurologic:  Alert and oriented x 3. Skin:  Intact without lesions or rashes. Cervical Nodes:  no significant adenopathy Inguinal Nodes:  no significant adenopathy Psych:  Normal affect.   Detailed Cardiovascular Exam  Neck    Carotids: Carotids full and equal bilaterally without bruits.      Neck Veins: Normal, no JVD.    Heart    Inspection: no deformities or lifts noted.      Palpation: normal PMI with no thrills palpable.      Auscultation: Soft systolic murmur, nonradiating, no diastolic murmurs  Vascular    Abdominal Aorta: no palpable masses, pulsations, or audible bruits.      Femoral Pulses: normal femoral pulses bilaterally.      Pedal Pulses: normal pedal pulses bilaterally.      Radial Pulses: normal radial pulses bilaterally.      Peripheral Circulation: no clubbing, cyanosis, or edema noted with normal capillary refill.     EKG  Procedure date:  11/18/2010  Findings:      Sinus rhythm, rate 79, axis within normal limits, intervals within normal limits, no acute ST-T wave changes  Impression & Recommendations:  Problem # 1:  SHORTNESS OF BREATH (ICD-786.05) The patient has shortness of breath, profound fatigue and decreased exercise tolerance. This could  all be an anginal equivalent. She has an abnormal EKG as described. Given this stress perfusion imaging is indicated. She would not be able to ambulate so this will be pharmacologic. Orders: Nuclear Stress Test (Nuc Stress Test)  Problem # 2:  HYPERTENSION (ICD-401.9) Her blood pressure is mildly elevated. And will continue the meds as listed. Of note she does have some orthostatic change and we discussed conservative measures including standing slowly, equilibrating, and wearing compression stockings  Problem # 3:  EMPHYSEMA (ICD-492.8) She may need referral for their evaluation of noncardiac etiology is forthcoming.  Patient Instructions: 1)  Your physician recommends that you continue on your current medications as directed. Please refer to the Current Medication list given to you today. 2)  Your physician has requested that you have a Lexican myoview.  For further information please visit [email or web address]  Please follow instruction sheet, as given.

## 2010-12-10 NOTE — Progress Notes (Signed)
Summary: Cancelled Myoview Study  Phone Note Call from Patient   Caller: Patient Action Taken: Phone Call Completed Summary of Call: Pt called this AM and cancelled Myoview. Did not wish to have test. Initial call taken by: Leonia Corona, RT-N,  December 02, 2010 9:25 AM     Appended Document: Cancelled Myoview Study order cancelled and message forwarded to Dr Antoine Poche for his information  Appended Document: Cancelled Myoview Study We will let the primary care know that she cancelled the appt.  Appended Document: Cancelled Myoview Study last office visit note and with cancel notice faxed to Dr Elisabeth Most for her information

## 2010-12-12 ENCOUNTER — Other Ambulatory Visit (HOSPITAL_COMMUNITY): Payer: Medicare Other

## 2010-12-18 LAB — CBC
HCT: 27.3 % — ABNORMAL LOW (ref 36.0–46.0)
Hemoglobin: 13 g/dL (ref 12.0–15.0)
Hemoglobin: 9.1 g/dL — ABNORMAL LOW (ref 12.0–15.0)
MCHC: 33.5 g/dL (ref 30.0–36.0)
MCHC: 34 g/dL (ref 30.0–36.0)
MCV: 93.1 fL (ref 78.0–100.0)
MCV: 93.4 fL (ref 78.0–100.0)
MCV: 93.9 fL (ref 78.0–100.0)
Platelets: 227 10*3/uL (ref 150–400)
Platelets: 235 10*3/uL (ref 150–400)
Platelets: 247 10*3/uL (ref 150–400)
RBC: 2.89 MIL/uL — ABNORMAL LOW (ref 3.87–5.11)
RBC: 2.95 MIL/uL — ABNORMAL LOW (ref 3.87–5.11)
RBC: 4.11 MIL/uL (ref 3.87–5.11)
RDW: 13.1 % (ref 11.5–15.5)
RDW: 13.4 % (ref 11.5–15.5)
WBC: 11.1 10*3/uL — ABNORMAL HIGH (ref 4.0–10.5)
WBC: 11.9 10*3/uL — ABNORMAL HIGH (ref 4.0–10.5)
WBC: 12.1 10*3/uL — ABNORMAL HIGH (ref 4.0–10.5)
WBC: 17.4 10*3/uL — ABNORMAL HIGH (ref 4.0–10.5)

## 2010-12-18 LAB — COMPREHENSIVE METABOLIC PANEL
Albumin: 2.6 g/dL — ABNORMAL LOW (ref 3.5–5.2)
Alkaline Phosphatase: 80 U/L (ref 39–117)
BUN: 35 mg/dL — ABNORMAL HIGH (ref 6–23)
CO2: 22 mEq/L (ref 19–32)
Calcium: 7.3 mg/dL — ABNORMAL LOW (ref 8.4–10.5)
Calcium: 8.5 mg/dL (ref 8.4–10.5)
Creatinine, Ser: 1.9 mg/dL — ABNORMAL HIGH (ref 0.4–1.2)
Creatinine, Ser: 2.21 mg/dL — ABNORMAL HIGH (ref 0.4–1.2)
GFR calc non Af Amer: 25 mL/min — ABNORMAL LOW (ref >60)
Glucose, Bld: 111 mg/dL — ABNORMAL HIGH (ref 70–99)
Glucose, Bld: 124 mg/dL — ABNORMAL HIGH (ref 70–99)
Total Protein: 6.2 g/dL (ref 6.0–8.3)

## 2010-12-18 LAB — POCT CARDIAC MARKERS
CKMB, poc: <1 ng/mL — ABNORMAL LOW (ref 1.0–8.0)
Myoglobin, poc: 180 ng/mL (ref 12–200)
Troponin i, poc: <0.05 ng/mL (ref 0.00–0.09)

## 2010-12-18 LAB — CK TOTAL AND CKMB (NOT AT ARMC)
CK, MB: 0.8 ng/mL (ref 0.3–4.0)
CK, MB: 1 ng/mL (ref 0.3–4.0)
Relative Index: INVALID (ref 0.0–2.5)
Relative Index: INVALID (ref 0.0–2.5)
Total CK: 31 U/L (ref 7–177)
Total CK: 33 U/L (ref 7–177)

## 2010-12-18 LAB — DIFFERENTIAL
Basophils Relative: 0 % (ref 0–1)
Eosinophils Absolute: 0 10*3/uL (ref 0.0–0.7)
Eosinophils Relative: 0 % (ref 0–5)
Lymphocytes Relative: 5 % — ABNORMAL LOW (ref 12–46)
Monocytes Absolute: 1.2 10*3/uL — ABNORMAL HIGH (ref 0.1–1.0)
Neutro Abs: 15.3 10*3/uL — ABNORMAL HIGH (ref 1.7–7.7)
Neutrophils Relative %: 88 % — ABNORMAL HIGH (ref 43–77)

## 2010-12-18 LAB — CROSSMATCH: ABO/RH(D): A POS

## 2010-12-18 LAB — PROTIME-INR
INR: 1.12 (ref 0.00–1.49)
Prothrombin Time: 14.3 seconds (ref 11.6–15.2)

## 2010-12-18 LAB — BASIC METABOLIC PANEL
BUN: 20 mg/dL (ref 6–23)
Calcium: 8.2 mg/dL — ABNORMAL LOW (ref 8.4–10.5)
Chloride: 103 mEq/L (ref 96–112)
Chloride: 104 mEq/L (ref 96–112)
Creatinine, Ser: 1.09 mg/dL (ref 0.4–1.2)
Creatinine, Ser: 1.3 mg/dL — ABNORMAL HIGH (ref 0.4–1.2)
GFR calc Af Amer: 58 mL/min — ABNORMAL LOW (ref >60)
GFR calc non Af Amer: 48 mL/min — ABNORMAL LOW (ref >60)
Glucose, Bld: 94 mg/dL (ref 70–99)

## 2010-12-18 LAB — TROPONIN I: Troponin I: 0.03 ng/mL (ref 0.00–0.06)

## 2010-12-18 LAB — URINALYSIS, ROUTINE W REFLEX MICROSCOPIC
Glucose, UA: NEGATIVE mg/dL
Specific Gravity, Urine: 1.017 (ref 1.005–1.030)

## 2010-12-18 LAB — URINE CULTURE

## 2010-12-18 LAB — URINE MICROSCOPIC-ADD ON

## 2010-12-18 LAB — FERRITIN: Ferritin: 231 ng/mL (ref 10–291)

## 2010-12-18 LAB — LACTIC ACID, PLASMA: Lactic Acid, Venous: 1.9 mmol/L (ref 0.5–2.2)

## 2010-12-18 LAB — CULTURE, BLOOD (ROUTINE X 2): Culture: NO GROWTH

## 2010-12-18 LAB — POCT I-STAT, CHEM 8
Chloride: 98 mEq/L (ref 96–112)
HCT: 41 % (ref 36.0–46.0)
Hemoglobin: 13.9 g/dL (ref 12.0–15.0)
Potassium: 4.4 mEq/L (ref 3.5–5.1)
Sodium: 129 mEq/L — ABNORMAL LOW (ref 135–145)

## 2010-12-18 LAB — HEMOGLOBIN AND HEMATOCRIT, BLOOD: HCT: 28.9 % — ABNORMAL LOW (ref 36.0–46.0)

## 2010-12-18 LAB — RETICULOCYTES: Retic Ct Pct: 0.8 % (ref 0.4–3.1)

## 2010-12-18 LAB — VITAMIN B12: Vitamin B-12: 240 pg/mL (ref 211–911)

## 2010-12-18 LAB — LIPASE, BLOOD: Lipase: 20 U/L (ref 11–59)

## 2010-12-18 LAB — IRON AND TIBC: Iron: <10 ug/dL — ABNORMAL LOW (ref 42–135)

## 2010-12-18 LAB — MAGNESIUM: Magnesium: 1.9 mg/dL (ref 1.5–2.5)

## 2010-12-18 LAB — ABO/RH: ABO/RH(D): A POS

## 2010-12-26 ENCOUNTER — Ambulatory Visit
Admission: RE | Admit: 2010-12-26 | Discharge: 2010-12-26 | Disposition: A | Discharge status: Home / Self Care | Payer: Medicare Other | Source: Ambulatory Visit | Attending: Internal Medicine | Admitting: Internal Medicine

## 2010-12-26 ENCOUNTER — Other Ambulatory Visit: Payer: Self-pay | Admitting: Internal Medicine

## 2010-12-26 DIAGNOSIS — R609 Edema, unspecified: Secondary | ICD-10-CM

## 2011-02-11 NOTE — Assessment & Plan Note (Signed)
[facility]                             PULMONARY OFFICE NOTE   [name], [name]                  MRN:          [id number]  DATE:04/30/2007                            DOB:          Jul 26, 1925    PROBLEM LIST:  1. Chronic obstructive pulmonary disease.  2. Esophageal reflux.   HISTORY OF PRESENT ILLNESS:  She is aware of an occasional skipped beat  if she feels her own pulse but otherwise, has nothing to point out this  visit. She has been staying in to avoid the heat. A little cough, no  phlegm. She asks about turning in oxygen, which she has had for years  without using. The concentrator takes up room in her home and annoys  her. She is not sure if Singulair is doing anything and asks about  reducing medications.   CURRENT MEDICATIONS:  Benicar 20 mg, Advair 250/50, Atrovent rescue  inhaler p.r.n., Singulair 10 mg, Lipitor 10, Prevacid 30 mg, Diltiazem  180 mg, Boniva 150 mg, aspirin 81 mg, Patanol, Travatan.   ALLERGIES:  NO KNOWN DRUG ALLERGIES.   PHYSICAL EXAMINATION:  VITAL SIGNS:  Weight 175 pounds, blood pressure  126/74, pulse 76, room air saturation 93%.  GENERAL:  She looks comfortable.  CARDIOVASCULAR:  Pulse is regular now to my examination with no murmur  or gallop.  NECK:  No neck vein distention or edema.  EXTREMITIES:  Superficial varices are noted.  CHEST:  Clear.  LUNGS:  Breathing is unlabored with no cough or wheeze.   LABORATORY DATA:  Pulmonary function tests show moderate obstructive  airway disease with an FEV1 of 1.15 liters (65% predicted) FEV1/FVC  ratio 0.58, an insignificant response to bronchodilator. Measured total  lung capacity is normal at 83%. Diffusion is mildly reduced at 66%. On a  6 minute walk test, she went 321 meters without stopping but with  complaint of bilateral leg pain. Heart rate rose from 76 to 91 and after  2 minutes rest, was back down to 81. Oxygen saturation rose from 93% to  94%  and 2 minutes later was 96%, indicating adequate oxygenation and  cardiac response to the exercise.   IMPRESSION:  1. Chronic obstructive pulmonary disease with old scarring.   PLAN:  1. Try stopping Singulair.  2. Discontinue oxygen concentrator ([employer]).  3. Schedule return 4 months, earlier p.r.n.     Clinton D. Maple Hudson, MD, Tonny Bollman, FACP  Electronically Signed    CDY/MedQ  DD: 05/01/2007  DT: 05/01/2007  Job #: [id number]   cc:   Lovenia Kim, D.O.

## 2011-02-11 NOTE — Discharge Summary (Signed)
[name], [name]           ACCOUNT NO.:  [id number]   MEDICAL RECORD NO.:  [id number]          PATIENT TYPE:  INP   LOCATION:  5735                         FACILITY:  [facility]   PHYSICIAN:  Marcellus Scott, MD     DATE OF BIRTH:  10-02-1924   DATE OF ADMISSION:  01/25/2007  DATE OF DISCHARGE:  01/29/2007                               DISCHARGE SUMMARY   PRIMARY MEDICAL DOCTOR:  Lovenia Kim, D.O.   DISCHARGE DIAGNOSES:  1. Community-acquired multilobar right-sided pneumonia.  2. Anemia.  3. History of breast cancer.  4. Chronic obstructive pulmonary disease.  5. Osteoporosis.  6. Hypertension.  7. Dyslipidemia.  8. Calcified density, right lung base.   DISCHARGE MEDICATIONS:  1. Avelox 400 mg p.o. daily for one week.  2. Robitussin DM 10 mL p.o. q.4-6 h. p.r.n.  3. Prevacid 30 mg p.o. daily.  4. Atrovent 17 mcg per spray MDI 2 puffs inhaled q.i.d. p.r.n.  5. Enteric coated aspirin 81 mg p.o. daily.  6. Advair patient to use her home dosage, which is not clear.  7. Benicar at prior home dosage.  8. Travatan 0.004% ophthalmic solution, 1 drop each eye daily.  9. Calcium 500 plus vitamin D, 1 p.o. b.i.d.  10.Singulair 10 mg p.o. daily.  11.Tylenol 650 mg p.o. q.4-6 h. p.r.n.  12.Cardizem at home dosage which is not clear.  13.Suggested albuterol inhaler for rescue use; however, patient      refused this saying it makes her dizzy and uncomfortable and hence      does not use this.   STUDIES:  1. CT of the chest with contrast on January 26, 2007.  Impression:      a.     Air space opacities in the right upper, lower, and middle       lobes are present but are most confluent in the right lower lobe.       The appearance favors pneumonia.  Recommend followup chest x-ray       at three weeks following treatment.  If the air space opacity has       not improved in that time, other possibilities such as malignancy,       pulmonary hemorrhage or non-response to treatment  must be       considered.      b.     Calcific density of the right lung base, likely a large       granuloma but could represent a pulmonary hamartoma.      c.     Moderate hiatal hernia.      d.     Borderline enlarged pre-carinal lymph node that is probably       reactive.   1. CT of the abdomen with contrast on April 28 revealing:      a.     Hiatal hernia.      b.     Abnormal density in the right lower lobe that could       represent infiltrate.  Neoplastic disease is not excluded based on       this limited examination.  c.     Well-circumscribed low density areas in the liver, most       consistent with simple cysts.      d.     Renal cysts.   1. On April 28 a CT of the pelvis with contrast:  Impression is no      acute findings in the pelvis.   1. On April 28, chest x-ray:  Impression is:      a.     Opacity in the right lung suspicious for pneumonia.       Recommend radiographic followup until clearance to exclude       alternative etiology such as neoplasia.      b.     Possible right upper lobe lung nodule.  Consider followup PA       and lateral x-rays when possible.      c.     Moderate sized hiatal hernia.   PERTINENT LABORATORY DATA:  CBC on May 2:  Hemoglobin 9.9, hematocrit  30, white blood cell of 6.8, platelets of 307, MCV of 93.2, ESR of 95.  Basic metabolic panel on April 30 unremarkable.  Hepatic panel  remarkable for an albumin of 2.8.  Urinalysis with trace leukocytes,  however, only 0-2 white blood cells per high-power field and a few  bacteria.  Blood cultures:  No growth to date.  Final cultures to be  followed up as an outpatient.   CONSULTATIONS:  None.   HOSPITAL COURSE AND PATIENT DISPOSITION:  For details of the initial  part of the admission, please refer to the history and physical note  that was done by Dr. Gardiner Barefoot on January 25, 2007.  In summary,  [name] is a pleasant, 77 year old female with a history of COPD,  breast  cancer, hypertension, gastroesophageal reflux disease,  dyslipidemia, osteoporosis who presented with a three day history of  high fevers, productive cough, nausea, vomiting, back and leg pain.  Further evaluation in the emergency room revealed chest x-ray suggestive  of pneumonia and leukocytosis of 18,000 whereby patient was admitted  with an assessment of community-acquired pneumonia.  1. Community-acquired multilobar right-sided pneumonia.  Patient was      admitted to the hospital.  Blood cultures were sent off, which are      negative to date.  Patient was placed on IV fluid hydration and IV      antibiotics of ceftriaxone and Zithromax.  Patient was also      provided with oxygen, nebulizer treatment and Robitussin.  For      evaluation of questionable pulmonary nodule, a CT of the chest was      performed.  Findings are as indicated above.  Patient is to      complete an additional week's course of Avelox p.o. and a repeat      chest x-ray and/or CAT scan of the chest to be done to ensure      resolution of her findings.  Patient has also been instructed to      seek immediate medical attention if there is any deterioration in      her condition.  2. Anemia.  Patient on admission had a hemoglobin of 11.9.  There is      no obvious overt bleeding.  Patient is asymptomatic of this anemia      also.  The patient to be followed up as an outpatient with a repeat      CBC and anemia  workup as deemed necessary.  This might include a GI      workup if one has not been performed.  3. History of breast cancer.  Patient said she does not take Arimidex      and to continue followup as an outpatient with her oncologist.  4. COPD which has remained stable.  Patient is to continue her home      dose of Advair and atrovent inhalers p.r.n.  Patient refuses use of      albuterol for rescue inhalation.  5. Osteoporosis.  To continue calcium plus vitamin D. 6. Hypertension.  Patient was started on  Avapro in the hospital in      place of Benicar.  Patient is to continue her      home medications on discharge.  7. Dyslipidemia.  Patient is to continue her home Lipitor.  8. Calcified density right lower lobe - outpatient workup and followup      as deemed necessary.      Marcellus Scott, MD  Electronically Signed     AH/MEDQ  D:  01/29/2007  T:  01/30/2007  Job:  [id number]   cc:   Lovenia Kim, D.O.

## 2011-02-11 NOTE — H&P (Signed)
NAMEMarland Kitchen  SHERRILYN, NAIRN           ACCOUNT NO.:  [id number]   MEDICAL RECORD NO.:  [id number]          PATIENT TYPE:  INP   LOCATION:  5735                         FACILITY:  [facility]   PHYSICIAN:  Gardiner Barefoot, MD    DATE OF BIRTH:  07/07/1925   DATE OF ADMISSION:  01/25/2007  DATE OF DISCHARGE:                              HISTORY & PHYSICAL   PRIMARY CARE PHYSICIAN:  Dr. Marisue Brooklyn.   HISTORY OF PRESENT ILLNESS:  Ms. Glade is an 77 year old female with  past medical history of COPD, right-sided breast cancer, who presented  here with a three-day history of fever up to 101, productive cough,  nausea, vomiting, and back and leg pain.  Patient has history of  exacerbations of COPD as well as pneumonia but has not been hospitalized  greater than a year.  Patient presented to her PCP this afternoon and  was sent to the Emergency Room for further evaluation.  Patient  describes her abdominal pain as mild on the left side and right-sided  leg hamstring pain but patient continues to be able to walk.  Otherwise,  patient has been able to tolerate p.o.   PAST MEDICAL HISTORY:  1. Breast cancer.  2. Hypertension.  3. COPD.  4. GERD.  5. Hypercholesterolemia.  6. Osteoporosis.   MEDICINES:  The patient is unsure of the total list of medications, and  will bring a full list in the a.m.  She is aware of:  1. Singulair.  2. Lipitor.  3. Benicar.  4. Prevacid.   ALLERGIES:  NO KNOWN DRUG ALLERGIES.   SOCIAL HISTORY:  Patient has a long history of tobacco abuse, quit 20  years ago.   FAMILY HISTORY:  No known history of cancer.   REVIEW OF SYSTEMS:  Complete 12-point review of systems was obtained and  was negative other than as presented in history of present illness.   PHYSICAL EXAMINATION:  VITAL SIGNS:  Temperature is 97.2, pulse 97,  respiratory rate 18, blood pressure 124/66.  GENERAL:  The patient is awake, alert, oriented x3 and appears in no  acute distress.  HEENT:  Anicteric.  CARDIOVASCULAR:  Regular rate and rhythm.  No murmurs, rubs or gallops.  LUNGS:  Diffuse rhonchi and poor air entry.  ABDOMEN:  Soft, nontender, nondistended.  Positive bowel sounds.  No  hepatosplenomegaly.  EXTREMITIES:  No cyanosis, clubbing or edema.   LABORATORY DATA:  Chest x-ray shows some pneumonia.  WBC is 18 with  increased neutrophilia.  Hemoglobin is 11.9, platelets 308.  UA with  trace leukocytes and negative WBCs.  Sodium is 139.  Potassium 3.8.  Chloride is 107.  Bicarb is 26.  BUN is 15.  Glucose is 87.   IMPRESSION:  This is an 77 year old female with a community-acquired  pneumonia.   PLAN:  1. Pneumonia:  Will treat the patient for community-acquired pneumonia      with ceftriaxone at 1 gm q.24h. based on patient's age, as well as      azithromycin 500 mg q.24 hours.  I doubt this is a COPD      exacerbation  with her productive cough and infiltrate on the x-ray      and therefore will hold off on treating patient with steroids.      However, will continue with nebulizer treatments.  2. Cardiovascular:  The patient with hypertension and      hypercholesterolemia.  Will continue with her home medicines when      she brings her list in.  3. Breast cancer:  No acute issues at this time.  A nodule was noted      on her chest x-ray and she was unclear if this is chronic or new,      but we will have the patient referred back to her oncologist.   DISPOSITION:  The patient is a full code.      Gardiner Barefoot, MD  Electronically Signed     RWC/MEDQ  D:  01/25/2007  T:  01/26/2007  Job:  [id number]

## 2011-02-11 NOTE — Assessment & Plan Note (Signed)
[facility]                             PULMONARY OFFICE NOTE   CARLYON, NOLASCO                  MRN:          [id number]  DATE:03/19/2007                            DOB:          1925-04-15    PROBLEM:  Pulmonary consultation at the kind request of Dr. Elisabeth Most  for this 77 year old woman who complains of shortness of breath.   HISTORY:  She gives a history of several pneumonias.  Most recently she  was hospitalized between April 28 and May 2 at [facility] with a right-sided,  multilobar community pneumonia and additional problems of anemia,  history of breast cancer, COPD, osteoporosis, hypertension,  dyslipidemia, and a calcified density in the right lung base.  Workup  included chest CT.  She feels she has completely resolved from that  acute illness.  In retrospect, she recognizes that she had a reflux  event just before development of this hospital stay, with definite  substernal burning.  She always feels exertional dyspnea complicated by  back pain if she stands or walks for long periods of time.  Sometimes  she will cough up a little phlegm.   MEDICATIONS:  1. Benicar 20 mg.  2. Advair 250/50.  3. Atrovent rescue inhaler.  4. Singulair 10 mg.  5. Lipitor 10 mg.  6. Prevacid 30 mg.  7. Diltiazem 180 mg.  8. Boniva 150 mg per month.  9. Aspirin 81 mg.  10.Patanol eye drops.  11.Travatan eye drops.  12.Occasional Tylenol or Robitussin.   No medication allergy.   REVIEW OF SYSTEMS:  Dyspnea at rest and with exertion, back pain with  any exertion or prolonged standing, acid indigestion, occasional  difficulty swallowing with some tightness or food hang up which is  intermittent, headaches, swelling of feet, stiff joints.   PAST HISTORY:  1. Multiple pneumonias.  2. Pneumococcal vaccine several times.  3. No allergic rhinitis.  4. Hiatal hernia with reflux.  5. Hypertension.  6. Asthma.  7. Emphysema.  8. Elevated  cholesterol.  9. Breast cancer with lumpectomy and radiation.  10.No tuberculosis exposure.   Other surgeries were appendectomy, and twice she had repair of an  esophageal diverticulum.   SOCIAL HISTORY:  Quit smoking in 1988.  Widowed with children.  She  lives with her son.   FAMILY HISTORY:  Sister with breast cancer.   Chest x-ray:  Followup film of May 12 showed near complete resolution of  the right lower lobe infiltrate with a stable small-to-moderate hiatal  hernia.   OBJECTIVE:  Weight 172 pounds, BP 140/68, pulse 113, room air saturation  92%.  This is an alert, older woman doing a little pursed-lip breathing but in  no distress.  Neck veins are not distended, her throat is not irritated and looks  clear without stridor.  She is obese.  There is mild kyphosis.  Breath sounds are quite diminished bilaterally, but breathing is  unlabored, there is no dullness or rub.  Heart sounds are regular, I do not hear murmur or gallop.  She has spider veins at her feet, but no pitting edema or cyanosis.  IMPRESSION:  1. Dyspnea is probably multifactorial including chronic obstructive      pulmonary disease from old smoking, scarring from multiple      pneumonias.  2. Esophageal reflux which may be an active basis for some of her      pneumonia.  3. Deconditioning.   PLAN:  1. We are scheduling pulmonary function tests.  2. I have discussed reflux avoidance measures with her.  3. She is going to return after there is time to get the pulmonary      function tests done so we can follow up and look for any tricks      that may help.  Schedule return in 1 month but earlier p.r.n.  I      appreciate the chance to meet her.     Clinton D. Maple Hudson, MD, Tonny Bollman, FACP  Electronically Signed    CDY/MedQ  DD: 03/19/2007  DT: 03/20/2007  Job #: [id number]   cc:   Lovenia Kim, D.O.

## 2011-02-14 NOTE — Op Note (Signed)
[place]. [facility]  Patient:    KEILI, HASTEN Visit Number: [id number] MRN: [id number]          Service Type: DSU Location: [facility] Attending Physician:  Charlton Haws Dictated by:   Currie Paris, M.D. Proc. Date: 03/07/02 Admit Date:  03/07/2002 Discharge Date: 03/07/2002   CC:         Marinus Maw, M.D.   Operative Report  CCS# [id number]  PREOPERATIVE DIAGNOSIS:  Calcifications of right breast with atypical hyperplasia on core biopsy.  POSTOPERATIVE DIAGNOSIS:  Calcifications of right breast with atypical hyperplasia on core biopsy.  OPERATION PERFORMED:  Needle guided excision, right breast calcification.  SURGEON:  Currie Paris, M.D.  ANESTHESIA:  MAC.  INDICATIONS FOR PROCEDURE:  The patient is a 78 year old who had some moderately suspicious calcifications and core biopsy has shown some atypical hyperplasia.  It was felt that a more complete excision was required to be sure there was not some underlying DCIS.  DESCRIPTION OF PROCEDURE:  The patient had a guide wire placed and the films were reviewed.  The patient had no further questions and was taken to the operating room.  After satisfactory IV sedation had been given, the breast was prepped and draped.  I injected some local anesthesia.  The guide wire entered at about the 6 oclock position and tracked deep and superior.  I made a curvilinear incision at the guide wire entry site.  I divided a little of the subcutaneous tissues and then grasped the tissue on either side of the guide wire with some Allis clamps and used that as traction while using the cautery to excise a large area of tissue around the guide wire.  As it got a little deeper, I encountered what I thought initially was pectoralis muscle and ended the dissection here coming across the tissue but then found the guide wire and went deep to this.  A little further dissection showed that this  red tissue was really an organizing hematoma and I then went back and excised widely around the hematoma.  From a three dimensional standpoint, it appeared that most of this tissue was almost to the chest wall but directly behind the areolar margin inferiorly.  The specimen was labeled and sent for specimen mammography and appeared to have the calcifications in the lesion.  While waiting for that, the wound was irrigated.  I used additional local as needed.  Once everything was dry, it was closed in layers with 3-0 Vicryl, followed by 4-0 Monocryl subcuticular plus Steri-Strips.  The patient tolerated the procedure well.  There were no complications.  All counts were correct. Dictated by:   Currie Paris, M.D. Attending Physician:  Charlton Haws DD:  03/07/02 TD:  03/09/02 Job: [id number]

## 2011-03-31 ENCOUNTER — Encounter: Payer: Self-pay | Admitting: Cardiology

## 2011-06-20 ENCOUNTER — Other Ambulatory Visit: Payer: Self-pay | Admitting: Internal Medicine

## 2011-06-20 DIAGNOSIS — Z1231 Encounter for screening mammogram for malignant neoplasm of breast: Secondary | ICD-10-CM

## 2011-07-04 ENCOUNTER — Ambulatory Visit
Admission: RE | Admit: 2011-07-04 | Discharge: 2011-07-04 | Disposition: A | Discharge status: Home / Self Care | Payer: Medicare Other | Source: Ambulatory Visit | Attending: Internal Medicine | Admitting: Internal Medicine

## 2011-07-04 DIAGNOSIS — Z1231 Encounter for screening mammogram for malignant neoplasm of breast: Secondary | ICD-10-CM

## 2011-10-06 DIAGNOSIS — J209 Acute bronchitis, unspecified: Secondary | ICD-10-CM | POA: Diagnosis not present

## 2011-10-08 DIAGNOSIS — R7309 Other abnormal glucose: Secondary | ICD-10-CM | POA: Diagnosis not present

## 2011-10-08 DIAGNOSIS — I10 Essential (primary) hypertension: Secondary | ICD-10-CM | POA: Diagnosis not present

## 2011-10-08 DIAGNOSIS — E559 Vitamin D deficiency, unspecified: Secondary | ICD-10-CM | POA: Diagnosis not present

## 2011-10-08 DIAGNOSIS — E538 Deficiency of other specified B group vitamins: Secondary | ICD-10-CM | POA: Diagnosis not present

## 2011-10-08 DIAGNOSIS — N3000 Acute cystitis without hematuria: Secondary | ICD-10-CM | POA: Diagnosis not present

## 2011-10-08 DIAGNOSIS — R413 Other amnesia: Secondary | ICD-10-CM | POA: Diagnosis not present

## 2011-10-08 DIAGNOSIS — Z79899 Other long term (current) drug therapy: Secondary | ICD-10-CM | POA: Diagnosis not present

## 2011-10-08 DIAGNOSIS — E782 Mixed hyperlipidemia: Secondary | ICD-10-CM | POA: Diagnosis not present

## 2011-11-19 DIAGNOSIS — I10 Essential (primary) hypertension: Secondary | ICD-10-CM | POA: Diagnosis not present

## 2011-11-19 DIAGNOSIS — E782 Mixed hyperlipidemia: Secondary | ICD-10-CM | POA: Diagnosis not present

## 2012-01-27 DIAGNOSIS — K6289 Other specified diseases of anus and rectum: Secondary | ICD-10-CM | POA: Diagnosis not present

## 2012-01-27 DIAGNOSIS — N72 Inflammatory disease of cervix uteri: Secondary | ICD-10-CM | POA: Diagnosis not present

## 2012-02-20 ENCOUNTER — Emergency Department (HOSPITAL_COMMUNITY)
Admission: EM | Admit: 2012-02-20 | Discharge: 2012-02-21 | Disposition: A | Discharge status: Home / Self Care | Payer: Medicare Other | Attending: Emergency Medicine | Admitting: Emergency Medicine

## 2012-02-20 DIAGNOSIS — R609 Edema, unspecified: Secondary | ICD-10-CM | POA: Diagnosis not present

## 2012-02-20 DIAGNOSIS — Z853 Personal history of malignant neoplasm of breast: Secondary | ICD-10-CM | POA: Insufficient documentation

## 2012-02-20 DIAGNOSIS — R112 Nausea with vomiting, unspecified: Secondary | ICD-10-CM | POA: Insufficient documentation

## 2012-02-20 DIAGNOSIS — Z7982 Long term (current) use of aspirin: Secondary | ICD-10-CM | POA: Diagnosis not present

## 2012-02-20 DIAGNOSIS — N3000 Acute cystitis without hematuria: Secondary | ICD-10-CM | POA: Diagnosis not present

## 2012-02-20 DIAGNOSIS — I4891 Unspecified atrial fibrillation: Secondary | ICD-10-CM | POA: Diagnosis not present

## 2012-02-20 DIAGNOSIS — R1013 Epigastric pain: Secondary | ICD-10-CM | POA: Diagnosis not present

## 2012-02-20 DIAGNOSIS — I10 Essential (primary) hypertension: Secondary | ICD-10-CM | POA: Diagnosis not present

## 2012-02-20 DIAGNOSIS — Z79899 Other long term (current) drug therapy: Secondary | ICD-10-CM | POA: Insufficient documentation

## 2012-02-20 DIAGNOSIS — E7800 Pure hypercholesterolemia, unspecified: Secondary | ICD-10-CM | POA: Insufficient documentation

## 2012-02-20 DIAGNOSIS — R062 Wheezing: Secondary | ICD-10-CM | POA: Diagnosis not present

## 2012-02-20 DIAGNOSIS — J45909 Unspecified asthma, uncomplicated: Secondary | ICD-10-CM | POA: Diagnosis not present

## 2012-02-20 DIAGNOSIS — K449 Diaphragmatic hernia without obstruction or gangrene: Secondary | ICD-10-CM | POA: Diagnosis not present

## 2012-02-20 DIAGNOSIS — K219 Gastro-esophageal reflux disease without esophagitis: Secondary | ICD-10-CM | POA: Insufficient documentation

## 2012-02-20 DIAGNOSIS — N390 Urinary tract infection, site not specified: Secondary | ICD-10-CM

## 2012-02-20 MED ORDER — ONDANSETRON HCL 4 MG/2ML IJ SOLN
INTRAMUSCULAR | Status: AC
  Filled 2012-02-20: qty 4

## 2012-02-20 NOTE — ED Notes (Signed)
Bed:WA24<BR> Expected date:<BR> Expected time:<BR> Means of arrival:<BR> Comments:<BR> EMS

## 2012-02-20 NOTE — ED Notes (Signed)
Pt seen at PCP office today for nausea and poor intact intermittently for 1 week. Pt given compazine suppository by family. Pt given 4mg  Zofran IV per EMS plus an additional 4mg  Zofran IV.

## 2012-02-20 NOTE — ED Notes (Signed)
Pt present to ed via ems.  Pt family at bedside.  Pt c/o n/v on and off for a year.  Pt has lost a total of approx 50 lbs.  Pt family reports poor appetite.  Pt ate grits only today.  Per pt daughter, pt eats 2 gallons of ice cream a week only at times.  Pt hospitalized in the past for same thing.  Pt reports only PMH is copd.  Pt aaox3.  Pt reports pain to unknown regions.  Pt ekg done per ems ? Atrial fib.

## 2012-02-21 ENCOUNTER — Telehealth: Payer: Self-pay | Admitting: Internal Medicine

## 2012-02-21 LAB — COMPREHENSIVE METABOLIC PANEL
ALT: 10 U/L (ref 0–35)
AST: 20 U/L (ref 0–37)
Albumin: 3.6 g/dL (ref 3.5–5.2)
Alkaline Phosphatase: 60 U/L (ref 39–117)
Chloride: 98 mEq/L (ref 96–112)
Potassium: 3.8 mEq/L (ref 3.5–5.1)
Total Bilirubin: 0.4 mg/dL (ref 0.3–1.2)

## 2012-02-21 LAB — URINE MICROSCOPIC-ADD ON

## 2012-02-21 LAB — DIFFERENTIAL
Basophils Absolute: 0 10*3/uL (ref 0.0–0.1)
Basophils Relative: 0 % (ref 0–1)
Neutro Abs: 7.5 10*3/uL (ref 1.7–7.7)
Neutrophils Relative %: 84 % — ABNORMAL HIGH (ref 43–77)

## 2012-02-21 LAB — URINALYSIS, ROUTINE W REFLEX MICROSCOPIC
Bilirubin Urine: NEGATIVE
Glucose, UA: NEGATIVE mg/dL
Ketones, ur: >80 mg/dL — AB
Protein, ur: NEGATIVE mg/dL

## 2012-02-21 LAB — CBC
Hemoglobin: 12.1 g/dL (ref 12.0–15.0)
MCHC: 32.9 g/dL (ref 30.0–36.0)
RDW: 12.9 % (ref 11.5–15.5)
WBC: 9 10*3/uL (ref 4.0–10.5)

## 2012-02-21 LAB — TROPONIN I: Troponin I: <0.3 ng/mL (ref <0.30)

## 2012-02-21 MED ORDER — ONDANSETRON 8 MG PO TBDP: 4.0000 mg | ORAL_TABLET | Freq: Three times a day (TID) | ORAL | Status: AC | PRN

## 2012-02-21 MED ORDER — CEPHALEXIN 500 MG PO CAPS
500.0000 mg | ORAL_CAPSULE | Freq: Once | ORAL | Status: AC
  Administered 2012-02-21: 500 mg via ORAL
  Filled 2012-02-21: qty 1

## 2012-02-21 MED ORDER — CEPHALEXIN 500 MG PO CAPS: 500.0000 mg | ORAL_CAPSULE | Freq: Four times a day (QID) | ORAL | Status: DC

## 2012-02-21 MED ORDER — SODIUM CHLORIDE 0.9 % IV SOLN
INTRAVENOUS | Status: DC
  Administered 2012-02-21: 02:00:00 via INTRAVENOUS

## 2012-02-21 NOTE — ED Provider Notes (Signed)
History     CSN: [id number]  Arrival date & time 02/20/12  2227   First MD Initiated Contact with Patient 02/21/12 0119      Chief Complaint  Patient presents with  . Nausea and vomiting     (Consider location/radiation/quality/duration/timing/severity/associated sxs/prior treatment) HPI This is an 78 year old white female with a history of chronic poor appetite and weight gain of approximately 50 pounds over the past year. She has episodic nausea and vomiting. She began vomiting about 8 PM yesterday evening which consisted of dry heaves because she had not eaten anything. She was given Compazine rectally as well as hyoscyamine orally without relief. EMS was called and they administered IV Zofran without relief. Her symptoms have subsequently resolved while in the ED and she has not been nauseated for the past hour. She was seen by her PCP yesterday for epigastric discomfort and was placed on Prilosec. An EKG was obtained which the family states was unremarkable. EMS noted her to be in atrial fibrillation; she and her family deny a history of atrial fibrillation. She denies chest pain or shortness of breath. She states the epigastric pain she was having yesterday is no longer present but her stomach is tender due to vomiting. She states she feels weak. She has some chronic edema of the lower legs.  Past Medical History  Diagnosis Date  . Pneumonia     multiple  . Need for pneumococcal vaccine     had vaccine several times  . Gastroesophageal reflux disease with hiatal hernia   . Asthma   . High cholesterol   . Breast cancer     Past Surgical History  Procedure Date  . Lumpectomy and radiation     for breast cancer  . Throat surgery   . Appendectomy     Family History  Problem Relation Age of Onset  . Heart attack Brother     had in his 62s    History  Substance Use Topics  . Smoking status: Former Games developer  . Smokeless tobacco: Not on file   Comment: quit 1988  .  Alcohol Use:     OB History    Grav Para Term Preterm Abortions TAB SAB Ect Mult Living                  Review of Systems  All other systems reviewed and are negative.    Allergies  Codeine; Penicillins; and Sulfa antibiotics  Home Medications   Current Outpatient Rx  Name Route Sig Dispense Refill  . ASPIRIN 81 MG PO TABS Oral Take 81 mg by mouth daily.      Marland Kitchen DILTIAZEM HCL ER COATED BEADS 180 MG PO CP24 Oral Take 180 mg by mouth daily.      . DONEPEZIL HCL 10 MG PO TABS Oral Take 10 mg by mouth at bedtime.    Marland Kitchen HYOSCYAMINE SULFATE 0.125 MG SL SUBL Sublingual Place 0.125 mg under the tongue every 4 (four) hours as needed.    Marland Kitchen MONTELUKAST SODIUM 10 MG PO TABS Oral Take 10 mg by mouth daily.      Marland Kitchen OMEPRAZOLE 40 MG PO CPDR Oral Take 40 mg by mouth daily.      BP 132/74  Pulse 69  Temp(Src) 97.7 F (36.5 C) (Oral)  Resp 19  Ht 5\' 2"  (1.575 m)  Wt 124 lb (56.246 kg)  BMI 22.68 kg/m2  SpO2 100%  Physical Exam General: Well-developed, well-nourished female in no acute distress; appearance consistent  with age of record HENT: normocephalic, atraumatic Eyes: pupils equal round and reactive to light; extraocular muscles intact; lens implants Neck: supple Heart: Irregular rhythm Lungs: clear to auscultation bilaterally Abdomen: soft; nondistended; epigastric tenderness; bowel sounds present Extremities: No deformity; full range of motion; 1+ edema of lower leg Neurologic: Awake, alert; motor function intact in all extremities and symmetric; no facial droop Skin: Warm and dry    ED Course  Procedures (including critical care time)    MDM   Nursing notes and vitals signs, including pulse oximetry, reviewed.  Summary of this visit's results, reviewed by myself:  Labs:  Results for orders placed during the hospital encounter of 02/20/12  TROPONIN I      Component Value Range   Troponin I <0.30  <0.30 (ng/mL)  CBC      Component Value Range   WBC 9.0  4.0 -  10.5 (K/uL)   RBC 4.01  3.87 - 5.11 (MIL/uL)   Hemoglobin 12.1  12.0 - 15.0 (g/dL)   HCT 16.1  09.6 - 04.5 (%)   MCV 91.8  78.0 - 100.0 (fL)   MCH 30.2  26.0 - 34.0 (pg)   MCHC 32.9  30.0 - 36.0 (g/dL)   RDW 40.9  81.1 - 91.4 (%)   Platelets 291  150 - 400 (K/uL)  DIFFERENTIAL      Component Value Range   Neutrophils Relative 84 (*) 43 - 77 (%)   Neutro Abs 7.5  1.7 - 7.7 (K/uL)   Lymphocytes Relative 13  12 - 46 (%)   Lymphs Abs 1.2  0.7 - 4.0 (K/uL)   Monocytes Relative 3  3 - 12 (%)   Monocytes Absolute 0.3  0.1 - 1.0 (K/uL)   Eosinophils Relative 0  0 - 5 (%)   Eosinophils Absolute 0.0  0.0 - 0.7 (K/uL)   Basophils Relative 0  0 - 1 (%)   Basophils Absolute 0.0  0.0 - 0.1 (K/uL)  COMPREHENSIVE METABOLIC PANEL      Component Value Range   Sodium 135  135 - 145 (mEq/L)   Potassium 3.8  3.5 - 5.1 (mEq/L)   Chloride 98  96 - 112 (mEq/L)   CO2 21  19 - 32 (mEq/L)   Glucose, Bld 92  70 - 99 (mg/dL)   BUN 12  6 - 23 (mg/dL)   Creatinine, Ser 7.82  0.50 - 1.10 (mg/dL)   Calcium 8.6  8.4 - 95.6 (mg/dL)   Total Protein 6.6  6.0 - 8.3 (g/dL)   Albumin 3.6  3.5 - 5.2 (g/dL)   AST 20  0 - 37 (U/L)   ALT 10  0 - 35 (U/L)   Alkaline Phosphatase 60  39 - 117 (U/L)   Total Bilirubin 0.4  0.3 - 1.2 (mg/dL)   GFR calc non Af Amer 76 (*) >90 (mL/min)   GFR calc Af Amer 89 (*) >90 (mL/min)  URINALYSIS, ROUTINE W REFLEX MICROSCOPIC      Component Value Range   Color, Urine YELLOW  YELLOW    APPearance CLEAR  CLEAR    Specific Gravity, Urine 1.018  1.005 - 1.030    pH 6.0  5.0 - 8.0    Glucose, UA NEGATIVE  NEGATIVE (mg/dL)   Hgb urine dipstick TRACE (*) NEGATIVE    Bilirubin Urine NEGATIVE  NEGATIVE    Ketones, ur >80 (*) NEGATIVE (mg/dL)   Protein, ur NEGATIVE  NEGATIVE (mg/dL)   Urobilinogen, UA 0.2  0.0 - 1.0 (  mg/dL)   Nitrite NEGATIVE  NEGATIVE    Leukocytes, UA SMALL (*) NEGATIVE   URINE MICROSCOPIC-ADD ON      Component Value Range   Squamous Epithelial / LPF FEW (*) RARE      WBC, UA 7-10  <3 (WBC/hpf)   RBC / HPF 3-6  <3 (RBC/hpf)   Bacteria, UA MANY (*) RARE    Urine-Other MUCOUS PRESENT      Imaging Studies: No results found.    EKG Interpretation:  Date & Time: 02/21/2012 23:25 PM  Rate: 67  Rhythm: atrial fibrillation  QRS Axis: normal  Intervals: normal  ST/T Wave abnormalities: normal  Conduction Disutrbances:none  Narrative Interpretation: LVH  Old EKG Reviewed: Previously normal sinus rhythm  2:46 AM Discussed with [place] cardiologist on-call. She will arrange for close followup in the office of Dr. Antoine Poche. The patient and her family was advised of the importance of close followup, and that she will likely need cardiac monitoring, echocardiogram and other testing. There were advised of the risk of stroke if atrial fibrillation is not treated.          Hanley Seamen, MD 02/21/12 [phone]

## 2012-02-21 NOTE — ED Notes (Signed)
NADN.  Pt verbalizes understanding.  Pt denies n/v/or pain.  Pt aaox3.  Pt family at bedside.  Pt anxious to go home.  Darryl Nestle

## 2012-02-21 NOTE — Discharge Instructions (Signed)
Atrial Fibrillation Your caregiver has diagnosed you with atrial fibrillation (AFib). The heart normally beats very regularly; AFib is a type of irregular heartbeat. The heart rate may be faster or slower than normal. This can prevent your heart from pumping as well as it should. AFib can be constant (chronic) or intermittent (paroxysmal). CAUSES  Atrial fibrillation may be caused by:  Heart disease, including heart attack, coronary artery disease, heart failure, diseases of the heart valves, and others.   Blood clot in the lungs (pulmonary embolism).   Pneumonia or other infections.   Chronic lung disease.   Thyroid disease.   Toxins. These include alcohol, some medications (such as decongestant medications or diet pills), and caffeine.  In some people, no cause for AFib can be found. This is referred to as Lone Atrial Fibrillation. SYMPTOMS   Palpitations or a fluttering in your chest.   A vague sense of chest discomfort.   Shortness of breath.   Sudden onset of lightheadedness or weakness.  Sometimes, the first sign of AFib can be a complication of the condition. This could be a stroke or heart failure. DIAGNOSIS  Your description of your condition may make your caregiver suspicious of atrial fibrillation. Your caregiver will examine your pulse to determine if fibrillation is present. An EKG (electrocardiogram) will confirm the diagnosis. Further testing may help determine what caused you to have atrial fibrillation. This may include chest x-ray, echocardiogram, blood tests, or CT scans. PREVENTION  If you have previously had atrial fibrillation, your caregiver may advise you to avoid substances known to cause the condition (such as stimulant medications, and possibly caffeine or alcohol). You may be advised to use medications to prevent recurrence. Proper treatment of any underlying condition is important to help prevent recurrence. PROGNOSIS  Atrial fibrillation does tend to  become a chronic condition over time. It can cause significant complications (see below). Atrial fibrillation is not usually immediately life-threatening, but it can shorten your life expectancy. This seems to be worse in women. If you have lone atrial fibrillation and are under 60 years old, the risk of complications is very low, and life expectancy is not shortened. RISKS AND COMPLICATIONS  Complications of atrial fibrillation can include stroke, chest pain, and heart failure. Your caregiver will recommend treatments for the atrial fibrillation, as well as for any underlying conditions, to help minimize risk of complications. TREATMENT  Treatment for AFib is divided into several categories:  Treatment of any underlying condition.   Converting you out of AFib into a regular (sinus) rhythm.   Controlling rapid heart rate.   Prevention of blood clots and stroke.  Medications and procedures are available to convert your atrial fibrillation to sinus rhythm. However, recent studies have shown that this may not offer you any advantage, and cardiac experts are continuing research and debate on this topic. More important is controlling your rapid heartbeat. The rapid heartbeat causes more symptoms, and places strain on your heart. Your caregiver will advise you on the use of medications that can control your heart rate. Atrial fibrillation is a strong stroke risk. You can lessen this risk by taking blood thinning medications such as Coumadin (warfarin), or sometimes aspirin. These medications need close monitoring by your caregiver. Over-medication can cause bleeding. Too little medication may not protect against stroke. HOME CARE INSTRUCTIONS   If your caregiver prescribed medicine to make your heartbeat more normally, take as directed.   If blood thinners were prescribed by your caregiver, take EXACTLY as directed.     Perform blood tests EXACTLY as directed.   Quit smoking. Smoking increases your  cardiac and lung (pulmonary) risks.   DO NOT drink alcohol.   DO NOT drink caffeinated drinks (e.g. coffee, soda, chocolate, and leaf teas). You may drink decaffeinated coffee, soda or tea.   If you are overweight, you should choose a reduced calorie diet to lose weight. Please see a registered dietitian if you need more information about healthy weight loss. DO NOT USE DIET PILLS as they may aggravate heart problems.   If you have other heart problems that are causing AFib, you may need to eat a low salt, fat, and cholesterol diet. Your caregiver will tell you if this is necessary.   Exercise every day to improve your physical fitness. Stay active unless advised otherwise.   If your caregiver has given you a follow-up appointment, it is very important to keep that appointment. Not keeping the appointment could result in heart failure or stroke. If there is any problem keeping the appointment, you must call back to this facility for assistance.  SEEK MEDICAL CARE IF:  You notice a change in the rate, rhythm or strength of your heartbeat.   You develop an infection or any other change in your overall health status.  SEEK IMMEDIATE MEDICAL CARE IF:   You develop chest pain, abdominal pain, sweating, weakness or feel sick to your stomach (nausea).   You develop shortness of breath.   You develop swollen feet and ankles.   You develop dizziness, numbness, or weakness of your face or limbs, or any change in vision or speech.  MAKE SURE YOU:   Understand these instructions.   Will watch your condition.   Will get help right away if you are not doing well or get worse.  Document Released: 09/15/2005 Document Revised: 09/04/2011 Document Reviewed: 04/19/2008 [employer] Patient Information 2012 [employer]. 

## 2012-02-21 NOTE — Telephone Encounter (Signed)
Called by  [facility] ED.  Pt presented to their ED with nausea and vomiting (chronic issue).  She was incidentally found to be in AF, rate controlled.  Pt wants to go home.  They asked if there is any reason to admit her for just the AF.  Pt sees Dr. Antoine Poche, last seen > 1 year ago.  Pt has no cardiac sx per ED doc report and trop negative.  Advised him to have pt f/u [facility] in 1 week for echo, monitor.  ED will check TSH.  She is on aspirin 81, which will offer some CVA protection.  She will need to be evaluated for her candidacy for coumadin.  Pt has refused stress test in past per ED doc, but this has been recommended before, and may still be reasonable to do.  Also advised him to have pt f/u with her family doctor next week as well.  Will forward this note to Dr. Antoine Poche.

## 2012-02-22 LAB — URINE CULTURE

## 2012-02-24 DIAGNOSIS — I4891 Unspecified atrial fibrillation: Secondary | ICD-10-CM | POA: Diagnosis not present

## 2012-02-24 DIAGNOSIS — N3000 Acute cystitis without hematuria: Secondary | ICD-10-CM | POA: Diagnosis not present

## 2012-02-24 DIAGNOSIS — R1013 Epigastric pain: Secondary | ICD-10-CM | POA: Diagnosis not present

## 2012-02-24 DIAGNOSIS — I10 Essential (primary) hypertension: Secondary | ICD-10-CM | POA: Diagnosis not present

## 2012-02-26 ENCOUNTER — Ambulatory Visit: Payer: Medicare Other | Admitting: Cardiology

## 2012-03-25 ENCOUNTER — Ambulatory Visit: Payer: Medicare Other | Admitting: Cardiology

## 2012-04-19 ENCOUNTER — Other Ambulatory Visit: Payer: Self-pay | Admitting: Internal Medicine

## 2012-04-19 ENCOUNTER — Ambulatory Visit
Admission: RE | Admit: 2012-04-19 | Discharge: 2012-04-19 | Disposition: A | Discharge status: Home / Self Care | Payer: Medicare Other | Source: Ambulatory Visit | Attending: Internal Medicine | Admitting: Internal Medicine

## 2012-04-19 DIAGNOSIS — E559 Vitamin D deficiency, unspecified: Secondary | ICD-10-CM | POA: Diagnosis not present

## 2012-04-19 DIAGNOSIS — M79609 Pain in unspecified limb: Secondary | ICD-10-CM | POA: Diagnosis not present

## 2012-04-19 DIAGNOSIS — E782 Mixed hyperlipidemia: Secondary | ICD-10-CM | POA: Diagnosis not present

## 2012-04-19 DIAGNOSIS — R7309 Other abnormal glucose: Secondary | ICD-10-CM | POA: Diagnosis not present

## 2012-04-19 DIAGNOSIS — R609 Edema, unspecified: Secondary | ICD-10-CM

## 2012-04-19 DIAGNOSIS — M7989 Other specified soft tissue disorders: Secondary | ICD-10-CM | POA: Diagnosis not present

## 2012-04-19 DIAGNOSIS — D518 Other vitamin B12 deficiency anemias: Secondary | ICD-10-CM | POA: Diagnosis not present

## 2012-04-19 DIAGNOSIS — D539 Nutritional anemia, unspecified: Secondary | ICD-10-CM | POA: Diagnosis not present

## 2012-04-19 DIAGNOSIS — I10 Essential (primary) hypertension: Secondary | ICD-10-CM | POA: Diagnosis not present

## 2012-04-19 DIAGNOSIS — R52 Pain, unspecified: Secondary | ICD-10-CM

## 2012-04-19 DIAGNOSIS — N3000 Acute cystitis without hematuria: Secondary | ICD-10-CM | POA: Diagnosis not present

## 2012-06-22 DIAGNOSIS — R7309 Other abnormal glucose: Secondary | ICD-10-CM | POA: Diagnosis not present

## 2012-06-22 DIAGNOSIS — Z79899 Other long term (current) drug therapy: Secondary | ICD-10-CM | POA: Diagnosis not present

## 2012-06-22 DIAGNOSIS — D485 Neoplasm of uncertain behavior of skin: Secondary | ICD-10-CM | POA: Diagnosis not present

## 2012-06-22 DIAGNOSIS — I10 Essential (primary) hypertension: Secondary | ICD-10-CM | POA: Diagnosis not present

## 2012-06-22 DIAGNOSIS — E559 Vitamin D deficiency, unspecified: Secondary | ICD-10-CM | POA: Diagnosis not present

## 2012-06-22 DIAGNOSIS — Z23 Encounter for immunization: Secondary | ICD-10-CM | POA: Diagnosis not present

## 2012-07-14 DIAGNOSIS — Z961 Presence of intraocular lens: Secondary | ICD-10-CM | POA: Diagnosis not present

## 2012-07-29 DIAGNOSIS — Z23 Encounter for immunization: Secondary | ICD-10-CM | POA: Diagnosis not present

## 2012-09-10 DIAGNOSIS — E782 Mixed hyperlipidemia: Secondary | ICD-10-CM | POA: Diagnosis not present

## 2012-09-10 DIAGNOSIS — R7309 Other abnormal glucose: Secondary | ICD-10-CM | POA: Diagnosis not present

## 2012-09-10 DIAGNOSIS — E559 Vitamin D deficiency, unspecified: Secondary | ICD-10-CM | POA: Diagnosis not present

## 2012-09-10 DIAGNOSIS — Z79899 Other long term (current) drug therapy: Secondary | ICD-10-CM | POA: Diagnosis not present

## 2012-09-10 DIAGNOSIS — I10 Essential (primary) hypertension: Secondary | ICD-10-CM | POA: Diagnosis not present

## 2012-10-01 DIAGNOSIS — Z8262 Family history of osteoporosis: Secondary | ICD-10-CM | POA: Diagnosis not present

## 2012-10-08 DIAGNOSIS — M940 Chondrocostal junction syndrome [Tietze]: Secondary | ICD-10-CM | POA: Diagnosis not present

## 2012-10-11 ENCOUNTER — Other Ambulatory Visit: Payer: Self-pay | Admitting: Internal Medicine

## 2012-10-11 DIAGNOSIS — N63 Unspecified lump in unspecified breast: Secondary | ICD-10-CM

## 2012-10-19 ENCOUNTER — Other Ambulatory Visit: Payer: Medicare Other

## 2012-10-22 ENCOUNTER — Ambulatory Visit
Admission: RE | Admit: 2012-10-22 | Discharge: 2012-10-22 | Disposition: A | Discharge status: Home / Self Care | Payer: Medicare Other | Source: Ambulatory Visit | Attending: Internal Medicine | Admitting: Internal Medicine

## 2012-10-22 DIAGNOSIS — N63 Unspecified lump in unspecified breast: Secondary | ICD-10-CM

## 2012-12-20 DIAGNOSIS — E559 Vitamin D deficiency, unspecified: Secondary | ICD-10-CM | POA: Diagnosis not present

## 2012-12-20 DIAGNOSIS — E782 Mixed hyperlipidemia: Secondary | ICD-10-CM | POA: Diagnosis not present

## 2012-12-20 DIAGNOSIS — Z79899 Other long term (current) drug therapy: Secondary | ICD-10-CM | POA: Diagnosis not present

## 2012-12-20 DIAGNOSIS — I10 Essential (primary) hypertension: Secondary | ICD-10-CM | POA: Diagnosis not present

## 2012-12-20 DIAGNOSIS — R7309 Other abnormal glucose: Secondary | ICD-10-CM | POA: Diagnosis not present

## 2013-03-22 DIAGNOSIS — E538 Deficiency of other specified B group vitamins: Secondary | ICD-10-CM | POA: Diagnosis not present

## 2013-03-22 DIAGNOSIS — R072 Precordial pain: Secondary | ICD-10-CM | POA: Diagnosis not present

## 2013-03-22 DIAGNOSIS — I10 Essential (primary) hypertension: Secondary | ICD-10-CM | POA: Diagnosis not present

## 2013-03-22 DIAGNOSIS — E782 Mixed hyperlipidemia: Secondary | ICD-10-CM | POA: Diagnosis not present

## 2013-03-22 DIAGNOSIS — Z79899 Other long term (current) drug therapy: Secondary | ICD-10-CM | POA: Diagnosis not present

## 2013-03-22 DIAGNOSIS — E559 Vitamin D deficiency, unspecified: Secondary | ICD-10-CM | POA: Diagnosis not present

## 2013-03-22 DIAGNOSIS — R7309 Other abnormal glucose: Secondary | ICD-10-CM | POA: Diagnosis not present

## 2013-03-22 DIAGNOSIS — N3000 Acute cystitis without hematuria: Secondary | ICD-10-CM | POA: Diagnosis not present

## 2013-03-22 DIAGNOSIS — D649 Anemia, unspecified: Secondary | ICD-10-CM | POA: Diagnosis not present

## 2013-06-30 DIAGNOSIS — J209 Acute bronchitis, unspecified: Secondary | ICD-10-CM | POA: Diagnosis not present

## 2013-07-16 DIAGNOSIS — Z23 Encounter for immunization: Secondary | ICD-10-CM | POA: Diagnosis not present

## 2013-09-08 ENCOUNTER — Encounter: Payer: Self-pay | Admitting: Physician Assistant

## 2013-09-08 DIAGNOSIS — E559 Vitamin D deficiency, unspecified: Secondary | ICD-10-CM | POA: Insufficient documentation

## 2013-09-08 DIAGNOSIS — R7303 Prediabetes: Secondary | ICD-10-CM | POA: Insufficient documentation

## 2013-09-08 DIAGNOSIS — I10 Essential (primary) hypertension: Secondary | ICD-10-CM | POA: Insufficient documentation

## 2013-09-08 DIAGNOSIS — E782 Mixed hyperlipidemia: Secondary | ICD-10-CM | POA: Insufficient documentation

## 2013-09-12 ENCOUNTER — Other Ambulatory Visit: Payer: Self-pay | Admitting: Physician Assistant

## 2013-09-12 ENCOUNTER — Other Ambulatory Visit: Payer: Self-pay

## 2013-09-12 ENCOUNTER — Encounter: Payer: Self-pay | Admitting: Physician Assistant

## 2013-09-12 ENCOUNTER — Ambulatory Visit (INDEPENDENT_AMBULATORY_CARE_PROVIDER_SITE_OTHER): Payer: Medicare Other | Admitting: Physician Assistant

## 2013-09-12 VITALS — BP 132/62 | HR 80 | Temp 98.4°F | Resp 16 | Ht 63.0 in | Wt 127.0 lb

## 2013-09-12 DIAGNOSIS — R7309 Other abnormal glucose: Secondary | ICD-10-CM

## 2013-09-12 DIAGNOSIS — I10 Essential (primary) hypertension: Secondary | ICD-10-CM | POA: Diagnosis not present

## 2013-09-12 DIAGNOSIS — Z1212 Encounter for screening for malignant neoplasm of rectum: Secondary | ICD-10-CM

## 2013-09-12 DIAGNOSIS — J438 Other emphysema: Secondary | ICD-10-CM

## 2013-09-12 DIAGNOSIS — E782 Mixed hyperlipidemia: Secondary | ICD-10-CM

## 2013-09-12 DIAGNOSIS — N3000 Acute cystitis without hematuria: Secondary | ICD-10-CM | POA: Diagnosis not present

## 2013-09-12 DIAGNOSIS — E559 Vitamin D deficiency, unspecified: Secondary | ICD-10-CM

## 2013-09-12 DIAGNOSIS — E7800 Pure hypercholesterolemia, unspecified: Secondary | ICD-10-CM | POA: Diagnosis not present

## 2013-09-12 DIAGNOSIS — R7303 Prediabetes: Secondary | ICD-10-CM

## 2013-09-12 LAB — CBC WITH DIFFERENTIAL/PLATELET
Basophils Absolute: 0 10*3/uL (ref 0.0–0.1)
Basophils Relative: 1 % (ref 0–1)
Hemoglobin: 12 g/dL (ref 12.0–15.0)
MCHC: 33.2 g/dL (ref 30.0–36.0)
Monocytes Relative: 9 % (ref 3–12)
Neutro Abs: 3.1 10*3/uL (ref 1.7–7.7)
Neutrophils Relative %: 53 % (ref 43–77)

## 2013-09-12 LAB — HEPATIC FUNCTION PANEL
Bilirubin, Direct: 0.1 mg/dL (ref 0.0–0.3)
Indirect Bilirubin: 0.5 mg/dL (ref 0.0–0.9)
Total Bilirubin: 0.6 mg/dL (ref 0.3–1.2)

## 2013-09-12 LAB — BASIC METABOLIC PANEL WITH GFR
GFR, Est African American: 60 mL/min
GFR, Est Non African American: 52 mL/min — ABNORMAL LOW
Potassium: 4.5 mEq/L (ref 3.5–5.3)
Sodium: 132 mEq/L — ABNORMAL LOW (ref 135–145)

## 2013-09-12 LAB — LIPID PANEL
HDL: 94 mg/dL (ref >39)
LDL Cholesterol: 137 mg/dL — ABNORMAL HIGH (ref 0–99)
Triglycerides: 149 mg/dL (ref <150)
VLDL: 30 mg/dL (ref 0–40)

## 2013-09-12 MED ORDER — OMEPRAZOLE 40 MG PO CPDR: 40.0000 mg | DELAYED_RELEASE_CAPSULE | Freq: Every day | ORAL | Status: DC

## 2013-09-12 NOTE — Patient Instructions (Signed)

## 2013-09-12 NOTE — Progress Notes (Signed)
Complete Physical HPI Patient presents for complete physical.   Patient's blood pressure has been controlled at home. Patient denies chest pain, shortness of breath, dizziness. BP: 132/62 mmHg  Patient's cholesterol is diet controlled. She is not on any medications secondary to age. The patient's cholesterol last visit was LDL 152. The patient has been working on diet and exercise for prediabetes, denies changes in vision, polys, and paresthesias. Last A1C in office was 5.7 Last GFR was 56.  Current Medications:  Current Outpatient Prescriptions on File Prior to Visit  Medication Sig Dispense Refill  . aspirin 81 MG tablet Take 81 mg by mouth daily.        Marland Kitchen diltiazem (CARDIZEM CD) 180 MG 24 hr capsule Take 180 mg by mouth daily.        . montelukast (SINGULAIR) 10 MG tablet Take 10 mg by mouth daily.         No current facility-administered medications on file prior to visit.   Health Maintenance:  Tetanus: 2013 Pneumovax: 2008 Flu vaccine: 2014 Zostavax: 2013 Pap: 2001 negative MGM: 10/22/12 normal (had [place] left breast normal)  DEXA: 08/2012 osteopenia Colonoscopy: 2005 due 10 years EGD: N/A  Allergies:  Allergies  Allergen Reactions  . Codeine Nausea And Vomiting  . Desyrel [Trazodone]   . Penicillins     "childhood"  . Prednisone     fatigue  . Sulfa Antibiotics Nausea And Vomiting  . Theophyllines   . Ventolin [Albuterol]     tremor   Medical History:  Past Medical History  Diagnosis Date  . Pneumonia     multiple  . Need for pneumococcal vaccine     had vaccine several times  . Gastroesophageal reflux disease with hiatal hernia   . Asthma   . High cholesterol   . Breast cancer   . Hypertension   . Prediabetes   . Anxiety   . Depression   . COPD (chronic obstructive pulmonary disease)   . Vitamin D deficiency   . Osteoporosis    Surgical History:  Past Surgical History  Procedure Laterality Date  . Lumpectomy and radiation      for breast cancer  .  Throat surgery    . Appendectomy    . Repair zenker's diverticula     Family History:  Family History  Problem Relation Age of Onset  . Heart attack Brother     had in his 51s   Social History:  History   Social History  . Marital Status: Widowed    Spouse Name: N/A    Number of Children: N/A  . Years of Education: N/A   Occupational History  . Not on file.   Social History Main Topics  . Smoking status: Former Smoker -- 1.00 packs/day for 20 years    Types: Cigarettes    Quit date: 09/08/1998  . Smokeless tobacco: Never Used     Comment: quit 1988  . Alcohol Use: No  . Drug Use: No  . Sexual Activity: Not on file   Other Topics Concern  . Not on file   Social History Narrative   She lives with her son.    ROS Constitutional: Denies weight loss/gain, headaches, insomnia, fatigue, night sweats, and change in appetite. Eyes: Denies redness, blurred vision, diplopia, discharge, itchy, watery eyes.  ENT: +Post nasal drip clear mucus for one week. Dentures Denies discharge, congestion, sore throat, earache, hearing loss, dental pain, Tinnitus, Vertigo, Sinus pain, snoring.  Cardio: (Dr. Virgina Jock)  Echo  EF 55% 2011, cardiolite neg 2008 Denies chest pain, palpitations, irregular heartbeat, dyspnea, diaphoresis, orthopnea, PND, claudication, edema Respiratory: denies cough, dyspnea, pleurisy, hoarseness, wheezing.  Gastrointestinal: +  Hemorrhoids Denies dysphagia, heartburn, pain, cramps, nausea, vomiting, bloating, diarrhea, constipation, hematemesis, melena, hematochezia Genitourinary: Complains of increased urination and funny colored urine for one month. Denies dysuria, urgency, nocturia, hesitancy, discharge, hematuria, flank pain Breast: Denies Breast lumps, nipple discharge, bleeding.  Musculoskeletal: Denies arthralgia, myalgia, stiffness, Jt. Swelling, pain, Skin: Denies pruritis, rash, hives,  acne, eczema, changing in skin lesion Neuro: Denies Weakness, tremor,  incoordination, spasms, paresthesia, pain Psychiatric: Denies confusion, memory loss, sensory loss Endocrine: Denies change in weight, skin, hair change, nocturia, and paresthesia, Diabetic Denies Polys, visual blurring, hyper /hypo glycemic episodes.  Heme/Lymph: Denies Excessive bleeding, bruising, enlarged lymph nodes  Physical Exam: Estimated body mass index is 22.5 kg/(m^2) as calculated from the following:   Height as of this encounter: 5\' 3"  (1.6 m).   Weight as of this encounter: 127 lb (57.607 kg). Filed Vitals:   09/12/13 0906  BP: 132/62  Pulse: 80  Temp: 98.4 F (36.9 C)  Resp: 16   General Appearance: Well nourished, in no apparent distress. Eyes: PERRLA, EOMs, conjunctiva no swelling or erythema, normal fundi and vessels. Sinuses: No Frontal/maxillary tenderness ENT/Mouth: Ext aud canals clear, normal light reflex with TMs without erythema, bulging.  Dentures. No erythema, swelling, or exudate on post pharynx. Tonsils not swollen or erythematous. Hearing normal.  Neck: Supple, thyroid normal. No bruits Respiratory: Respiratory effort normal, BS equal bilaterally without rales, rhonchi, wheezing or stridor. Cardio: RRR without murmurs, rubs or gallops. Brisk peripheral pulses, 1+ edema Chest: symmetric, with normal excursions and percussion. Breasts: defer Abdomen: Soft, +BS. Non tender, no guarding, rebound, hernias, masses, or organomegaly. .  Lymphatics: Non tender without lymphadenopathy.  Genitourinary: defer Musculoskeletal: Full ROM all peripheral extremities,5/5 strength, and normal gait. Skin: Warm, dry without rashes, lesions, ecchymosis.  Neuro: Cranial nerves intact, reflexes equal bilaterally. Normal muscle tone, no cerebellar symptoms. Sensation intact.  Psych: Awake and oriented X 3, normal affect, Insight and Judgment appropriate.   EKG: LVH with strain, PRWP, no changes.  Assessment and Plan: GERD- stop nexium and ompeprazole 40  High cholesterol-  check Lipid  Breast cancer history - continue getting MGM  Hypertension- continue monitor at hom  Prediabetes- check level  Anxiety- controlled  Depression- controlled  COPD (chronic obstructive pulmonary disease)- cont monitor  Vitamin D deficiency- continue meds  Osteoporosis- due next year  Low fall risk Discussed med's effects and SE's. Screening labs and tests as requested with regular follow-up as recommended.   Quentin Mulling 9:17 AM

## 2013-09-13 LAB — URINALYSIS, MICROSCOPIC ONLY
Casts: NONE SEEN
Crystals: NONE SEEN
Squamous Epithelial / LPF: NONE SEEN

## 2013-09-13 LAB — URINE CULTURE

## 2013-09-13 LAB — MICROALBUMIN / CREATININE URINE RATIO
Creatinine, Urine: 134.9 mg/dL
Microalb Creat Ratio: 7.1 mg/g (ref 0.0–30.0)
Microalb, Ur: 0.96 mg/dL (ref 0.00–1.89)

## 2013-09-13 LAB — URINALYSIS, ROUTINE W REFLEX MICROSCOPIC
Bilirubin Urine: NEGATIVE
Ketones, ur: NEGATIVE mg/dL
Nitrite: NEGATIVE
Specific Gravity, Urine: 1.016 (ref 1.005–1.030)
pH: 6 (ref 5.0–8.0)

## 2013-09-26 ENCOUNTER — Encounter: Payer: Self-pay | Admitting: Physician Assistant

## 2013-09-26 ENCOUNTER — Ambulatory Visit (INDEPENDENT_AMBULATORY_CARE_PROVIDER_SITE_OTHER): Payer: Medicare Other | Admitting: Physician Assistant

## 2013-09-26 VITALS — BP 132/68 | HR 76 | Temp 98.3°F | Resp 16 | Wt 129.0 lb

## 2013-09-26 DIAGNOSIS — J209 Acute bronchitis, unspecified: Secondary | ICD-10-CM | POA: Diagnosis not present

## 2013-09-26 MED ORDER — PREDNISONE 20 MG PO TABS: ORAL_TABLET | ORAL | Status: DC

## 2013-09-26 MED ORDER — AZELASTINE HCL 0.1 % NA SOLN: NASAL | Status: DC

## 2013-09-26 MED ORDER — AZITHROMYCIN 250 MG PO TABS: ORAL_TABLET | ORAL | Status: DC

## 2013-09-26 NOTE — Progress Notes (Signed)
   Subjective:    Patient ID: [name], female    DOB: 02/22/1925, 79 y.o.   MRN: [id number]  Cough This is a new problem. The current episode started in the past 7 days. The problem has been gradually worsening. The problem occurs constantly. The cough is productive of purulent sputum. Associated symptoms include chills, shortness of breath and wheezing. Pertinent negatives include no chest pain, ear congestion, ear pain, fever, headaches, heartburn, hemoptysis, myalgias, nasal congestion, postnasal drip, rash, rhinorrhea, sore throat, sweats or weight loss. Nothing aggravates the symptoms. She has tried nothing for the symptoms. The treatment provided no relief. Her past medical history is significant for COPD.    Review of Systems  Constitutional: Positive for chills. Negative for fever, weight loss and fatigue.  HENT: Negative.  Negative for ear pain, postnasal drip, rhinorrhea and sore throat.   Eyes: Negative.   Respiratory: Positive for cough, shortness of breath and wheezing. Negative for hemoptysis.   Cardiovascular: Negative.  Negative for chest pain.  Gastrointestinal: Negative.  Negative for heartburn.  Genitourinary: Negative.   Musculoskeletal: Negative.  Negative for myalgias.  Skin: Negative for rash.  Neurological: Negative for headaches.      Objective:   Physical Exam  Constitutional: She is oriented to person, place, and time. She appears well-developed and well-nourished.  HENT:  Head: Normocephalic and atraumatic.  Right Ear: External ear normal.  Left Ear: External ear normal.  Nose: Nose normal.  Mouth/Throat: Oropharynx is clear and moist.  Eyes: Conjunctivae are normal. Pupils are equal, round, and reactive to light.  Neck: Normal range of motion. Neck supple.  Cardiovascular: Normal rate and regular rhythm.   Murmur heard. Pulmonary/Chest: Effort normal. No respiratory distress. She has wheezes. She has no rales. She exhibits no tenderness.   Abdominal: Soft. Bowel sounds are normal.  Lymphadenopathy:    She has no cervical adenopathy.  Neurological: She is alert and oriented to person, place, and time.  Skin: Skin is warm and dry.      Assessment & Plan:  Acute bronchitis - Plan: azithromycin (ZITHROMAX) 250 MG tablet, predniSONE (DELTASONE) 20 MG tablet, azelastine (ASTELIN) 137 MCG/SPRAY nasal spray

## 2013-09-26 NOTE — Patient Instructions (Signed)

## 2013-10-24 ENCOUNTER — Other Ambulatory Visit: Payer: Self-pay

## 2013-10-24 DIAGNOSIS — Z853 Personal history of malignant neoplasm of breast: Secondary | ICD-10-CM

## 2013-10-24 DIAGNOSIS — Z1231 Encounter for screening mammogram for malignant neoplasm of breast: Secondary | ICD-10-CM

## 2013-11-14 ENCOUNTER — Ambulatory Visit: Payer: Medicare Other

## 2013-11-14 ENCOUNTER — Ambulatory Visit
Admission: RE | Admit: 2013-11-14 | Discharge: 2013-11-14 | Disposition: A | Discharge status: Home / Self Care | Payer: Medicare Other | Source: Ambulatory Visit

## 2013-11-14 DIAGNOSIS — Z1231 Encounter for screening mammogram for malignant neoplasm of breast: Secondary | ICD-10-CM

## 2013-11-14 DIAGNOSIS — Z853 Personal history of malignant neoplasm of breast: Secondary | ICD-10-CM

## 2013-12-05 ENCOUNTER — Ambulatory Visit (INDEPENDENT_AMBULATORY_CARE_PROVIDER_SITE_OTHER): Payer: Medicare Other | Admitting: Internal Medicine

## 2013-12-05 ENCOUNTER — Encounter: Payer: Self-pay | Admitting: Internal Medicine

## 2013-12-05 VITALS — BP 148/72 | HR 84 | Temp 97.9°F | Resp 16 | Ht 63.0 in | Wt 130.8 lb

## 2013-12-05 DIAGNOSIS — Z79899 Other long term (current) drug therapy: Secondary | ICD-10-CM | POA: Insufficient documentation

## 2013-12-05 DIAGNOSIS — R7309 Other abnormal glucose: Secondary | ICD-10-CM

## 2013-12-05 DIAGNOSIS — I10 Essential (primary) hypertension: Secondary | ICD-10-CM | POA: Diagnosis not present

## 2013-12-05 DIAGNOSIS — E782 Mixed hyperlipidemia: Secondary | ICD-10-CM | POA: Diagnosis not present

## 2013-12-05 DIAGNOSIS — R7303 Prediabetes: Secondary | ICD-10-CM

## 2013-12-05 DIAGNOSIS — E559 Vitamin D deficiency, unspecified: Secondary | ICD-10-CM | POA: Diagnosis not present

## 2013-12-05 DIAGNOSIS — J019 Acute sinusitis, unspecified: Secondary | ICD-10-CM

## 2013-12-05 LAB — CBC WITH DIFFERENTIAL/PLATELET
BASOS PCT: 1 % (ref 0–1)
Basophils Absolute: 0.1 10*3/uL (ref 0.0–0.1)
Eosinophils Absolute: 0.4 10*3/uL (ref 0.0–0.7)
Eosinophils Relative: 6 % — ABNORMAL HIGH (ref 0–5)
HEMATOCRIT: 32.1 % — AB (ref 36.0–46.0)
HEMOGLOBIN: 10.6 g/dL — AB (ref 12.0–15.0)
Lymphocytes Relative: 32 % (ref 12–46)
Lymphs Abs: 1.9 10*3/uL (ref 0.7–4.0)
MCH: 29.3 pg (ref 26.0–34.0)
MCHC: 33 g/dL (ref 30.0–36.0)
MCV: 88.7 fL (ref 78.0–100.0)
MONOS PCT: 9 % (ref 3–12)
Monocytes Absolute: 0.5 10*3/uL (ref 0.1–1.0)
NEUTROS ABS: 3.1 10*3/uL (ref 1.7–7.7)
Neutrophils Relative %: 52 % (ref 43–77)
Platelets: 357 10*3/uL (ref 150–400)
RBC: 3.62 MIL/uL — AB (ref 3.87–5.11)
RDW: 14.2 % (ref 11.5–15.5)
WBC: 6 10*3/uL (ref 4.0–10.5)

## 2013-12-05 LAB — HEMOGLOBIN A1C
HEMOGLOBIN A1C: 5.8 % — AB (ref <5.7)
Mean Plasma Glucose: 120 mg/dL — ABNORMAL HIGH (ref <117)

## 2013-12-05 MED ORDER — LEVOFLOXACIN 500 MG PO TABS: 500.0000 mg | ORAL_TABLET | Freq: Every day | ORAL | Status: DC

## 2013-12-05 MED ORDER — PREDNISONE 20 MG PO TABS: 20.0000 mg | ORAL_TABLET | ORAL | Status: DC

## 2013-12-05 NOTE — Progress Notes (Signed)
Patient ID: [name], female   DOB: July 26, 1925, 80 y.o.   MRN: [id number]    This very nice 80 y.o. [facility] presents for 3 month follow up with Hypertension, Hyperlipidemia, Pre-Diabetes and Vitamin D Deficiency. Today patient c/o coughing up sinus drainage which is purulent and yellow green.   HTN predates since 44. Today's BP: 148/72 mmHg . Patient denies any cardiac type chest pain, palpitations, dyspnea/orthopnea/PND, dizziness, claudication, or dependent edema.   Hyperlipidemia is controlled with diet & meds. Patient denies myalgias or other med SE's.  Last lipids elevated as below and not treating due to advanced age. Lab Results  Component Value Date   CHOL 261* 09/12/2013   HDL 94 09/12/2013   LDLCALC 137* 09/12/2013   TRIG 149 09/12/2013   CHOLHDL 2.8 09/12/2013      Also, the patient has history of PreDiabetes with A1c 5.9% in Nov 2012 and with last A1c of  5.7% in June 2014. Patient denies any symptoms of reactive hypoglycemia, diabetic polys, paresthesias or visual blurring.   Further, Patient has history of Vitamin D Deficiency 41 in July 2013with last vitamin D of   . Patient supplements vitamin D without any suspected side-effects.    Medication List       aspirin 81 MG tablet  Take 81 mg by mouth daily.     azelastine 137 MCG/SPRAY nasal spray  Commonly known as:  ASTELIN  Use in each nostril as directed     cholecalciferol 1000 UNITS tablet  Commonly known as:  VITAMIN D  Take 5,000 Units by mouth daily.     diltiazem 180 MG 24 hr capsule  Commonly known as:  CARDIZEM CD  Take 180 mg by mouth daily.     levofloxacin 500 MG tablet  Commonly known as:  LEVAQUIN  Take 1 tablet (500 mg total) by mouth daily.     predniSONE 20 MG tablet  Commonly known as:  DELTASONE  Take 1 tablet (20 mg total) by mouth See admin instructions. 1 tab 3 x day for 3 days, then 1 tab 2 x day for 3 days, then 1 tab 1 x day for 5 days         Allergies  Allergen  Reactions  . Codeine Nausea And Vomiting  . Desyrel [Trazodone]   . Penicillins     "childhood"  . Prednisone     fatigue  . Sulfa Antibiotics Nausea And Vomiting  . Theophyllines   . Ventolin [Albuterol]     tremor    PMHx:   Past Medical History  Diagnosis Date  . Pneumonia     multiple  . Need for pneumococcal vaccine     had vaccine several times  . Gastroesophageal reflux disease with hiatal hernia   . Asthma   . High cholesterol   . Breast cancer   . Hypertension   . Prediabetes   . Anxiety   . Depression   . COPD (chronic obstructive pulmonary disease)   . Vitamin D deficiency   . Osteoporosis     FHx:    Reviewed / unchanged  SHx:    Reviewed / unchanged  Systems Review: Constitutional: Denies fever, chills, wt changes, headaches, insomnia, fatigue, night sweats, change in appetite. Eyes: Denies redness, blurred vision, diplopia, discharge, itchy, watery eyes.  ENT: Denies discharge, congestion, post nasal drip, epistaxis, sore throat, earache, hearing loss, dental pain, tinnitus, vertigo, snoring. Having some sinus pressure and pain CV: Denies chest pain, palpitations, irregular  heartbeat, syncope, dyspnea, diaphoresis, orthopnea, PND, claudication, edema. Respiratory: denies cough, dyspnea, DOE, pleurisy, hoarseness, laryngitis, wheezing.  Gastrointestinal: Denies dysphagia, odynophagia, heartburn, reflux, water brash, abdominal pain or cramps, nausea, vomiting, bloating, diarrhea, constipation, hematemesis, melena, hematochezia,  or hemorrhoids. Genitourinary: Denies dysuria, frequency, urgency, nocturia, hesitancy, discharge, hematuria, flank pain. Musculoskeletal: Denies arthralgias, myalgias, stiffness, jt. swelling, pain, limp, strain/sprain.  Skin: Denies pruritus, rash, hives, warts, acne, eczema, change in skin lesion(s). Neuro: No weakness, tremor, incoordination, spasms, paresthesia, or pain. Psychiatric: Denies confusion, memory loss, or sensory  loss. Endo: Denies change in weight, skin, hair change.  Heme/Lymph: No excessive bleeding, bruising, orenlarged lymph nodes.  Filed Vitals:   12/05/13 0958  BP: 148/72  Pulse: 84  Temp: 97.9 F (36.6 C)  Resp: 16    Estimated body mass index is 23.18 kg/(m^2) as calculated from the following:   Height as of this encounter: 5\' 3"  (1.6 m).   Weight as of this encounter: 130 lb 12.8 oz (59.33 kg).  On Exam: Appears well nourished - in no distress. Eyes: PERRLA, EOMs, conjunctiva no swelling or erythema. Sinuses: No frontal tenderness. Slight maxillary tenderness. ENT/Mouth: EAC's clear, TM's nl w/o erythema, bulging. Nares clear w/o erythema, swelling, exudates. Oropharynx clear without erythema or exudates. Oral hygiene is good. Tongue normal, non obstructing. Hearing intact.  Neck: Supple. Thyroid nl. Car 2+/2+ without bruits, nodes or JVD. Chest: Respirations nl with BS clear & equal w/o rhonchi, wheezing or stridor.  Few scattered rales. Cor: Heart sounds normal w/ regular rate and rhythm without sig. murmurs, gallops, clicks, or rubs. Peripheral pulses normal and equal  without edema.  Abdomen: Soft & bowel sounds normal. Non-tender w/o guarding, rebound, hernias, masses, or organomegaly.  Lymphatics: Unremarkable.  Musculoskeletal: Full ROM all peripheral extremities, joint stability, 5/5 strength, and normal gait.  Skin: Warm, dry without exposed rashes, lesions, ecchymosis apparent.  Neuro: Cranial nerves intact, reflexes equal bilaterally. Sensory-motor testing grossly intact. Tendon reflexes grossly intact.  Pysch: Alert & oriented x 3. Insight and judgement nl & appropriate. No ideations.  Assessment and Plan:  1. Hypertension - Continue monitor blood pressure at home. Continue diet/meds same.  2. Hyperlipidemia - Continue diet/meds, exercise,& lifestyle modifications. Continue monitor periodic cholesterol/liver & renal functions   3. Pre-diabetes - Continue diet,  exercise, lifestyle modifications. Monitor appropriate labs.  4. Vitamin D Deficiency - Continue supplementation.  5. Sinobronchial Syndrome - Rx Levaquin and Prednisone taper  Recommended regular exercise, BP monitoring, weight control, and discussed med and SE's. Recommended labs to assess and monitor clinical status. Further disposition pending results of labs.

## 2013-12-05 NOTE — Patient Instructions (Signed)

## 2013-12-06 LAB — HEPATIC FUNCTION PANEL
ALT: <8 U/L (ref 0–35)
AST: 12 U/L (ref 0–37)
Albumin: 3.7 g/dL (ref 3.5–5.2)
Alkaline Phosphatase: 62 U/L (ref 39–117)
TOTAL PROTEIN: 6.1 g/dL (ref 6.0–8.3)
Total Bilirubin: 0.3 mg/dL (ref 0.2–1.2)

## 2013-12-06 LAB — MAGNESIUM: Magnesium: 1.8 mg/dL (ref 1.5–2.5)

## 2013-12-06 LAB — LIPID PANEL
CHOLESTEROL: 203 mg/dL — AB (ref 0–200)
HDL: 61 mg/dL (ref >39)
LDL Cholesterol: 84 mg/dL (ref 0–99)
Total CHOL/HDL Ratio: 3.3 Ratio
Triglycerides: 289 mg/dL — ABNORMAL HIGH (ref <150)
VLDL: 58 mg/dL — AB (ref 0–40)

## 2013-12-06 LAB — BASIC METABOLIC PANEL WITH GFR
BUN: 11 mg/dL (ref 6–23)
CHLORIDE: 104 meq/L (ref 96–112)
CO2: 26 mEq/L (ref 19–32)
Calcium: 9.2 mg/dL (ref 8.4–10.5)
Creat: 0.8 mg/dL (ref 0.50–1.10)
GFR, EST AFRICAN AMERICAN: 77 mL/min
GFR, Est Non African American: 67 mL/min
GLUCOSE: 93 mg/dL (ref 70–99)
POTASSIUM: 4.4 meq/L (ref 3.5–5.3)
SODIUM: 139 meq/L (ref 135–145)

## 2013-12-06 LAB — TSH: TSH: 1.111 u[IU]/mL (ref 0.350–4.500)

## 2013-12-06 LAB — INSULIN, FASTING: Insulin fasting, serum: 9 u[IU]/mL (ref 3–28)

## 2013-12-06 LAB — VITAMIN D 25 HYDROXY (VIT D DEFICIENCY, FRACTURES): Vit D, 25-Hydroxy: 70 ng/mL (ref 30–89)

## 2014-01-18 ENCOUNTER — Other Ambulatory Visit: Payer: Self-pay | Admitting: Internal Medicine

## 2014-01-24 ENCOUNTER — Telehealth: Payer: Self-pay | Admitting: *Deleted

## 2014-01-24 NOTE — Telephone Encounter (Signed)
Daughter, Vallery Ridge, called regarding patient's Diltiazem. Patient stopped med in 08/2013 and recently restarted med.  Patient having swelling in her feet and asked if due to med.  Per Dr Melford Aase, Diltiazem can cause swelling and stop med and check BP. Called and informed son, Annie Main, because daughter is not on patient's HIPAA form.  Son will speak to Vallery Ridge about med.

## 2014-01-29 DIAGNOSIS — S9000XA Contusion of unspecified ankle, initial encounter: Secondary | ICD-10-CM | POA: Diagnosis not present

## 2014-01-29 DIAGNOSIS — S60229A Contusion of unspecified hand, initial encounter: Secondary | ICD-10-CM | POA: Diagnosis not present

## 2014-01-29 DIAGNOSIS — M7730 Calcaneal spur, unspecified foot: Secondary | ICD-10-CM | POA: Diagnosis not present

## 2014-01-30 ENCOUNTER — Ambulatory Visit: Payer: Medicare Other | Admitting: Internal Medicine

## 2014-01-30 ENCOUNTER — Encounter: Payer: Self-pay | Admitting: Internal Medicine

## 2014-01-30 VITALS — BP 140/76 | HR 88 | Temp 98.2°F | Resp 16 | Ht 63.0 in | Wt 123.6 lb

## 2014-01-30 DIAGNOSIS — S20219A Contusion of unspecified front wall of thorax, initial encounter: Secondary | ICD-10-CM

## 2014-01-30 DIAGNOSIS — F0280 Dementia in other diseases classified elsewhere without behavioral disturbance: Secondary | ICD-10-CM

## 2014-01-30 DIAGNOSIS — G301 Alzheimer's disease with late onset: Secondary | ICD-10-CM

## 2014-01-30 NOTE — Progress Notes (Signed)
   Subjective:    Patient ID: [name], female    DOB: 1925/09/15, 80 y.o.   MRN: [id number]  HPI Patient brought in by daughter after a fall at home 2 days ago and seen at Urgent care and had Neg. X-rays of Rt hand and lt ankle. She was told no Fx - just sprain of ankle and ace wrap applied. Dorsal Rt hand had small clean dry laceration . Patient confused at her baseline and has no recall of the fall nor c/o pain.  Per her daughter she is not taking any of her medicines below.  Medication Sig  . cholecalciferol (VITAMIN D) 1000 UNITS tablet Take 5,000 Units by mouth daily.  Marland Kitchen aspirin 81 MG tablet Take 81 mg by mouth daily.    Marland Kitchen azelastine (ASTELIN) 137 MCG/SPRAY nasal spray Use in each nostril as directed  . diltiazem (CARDIZEM CD) 180 MG 24 hr capsule TAKE 1 CAPSULE BY MOUTH DAILY FOR BLOOD PRESSURE    Allergies  Allergen Reactions  . Codeine Nausea And Vomiting  . Desyrel [Trazodone]   . Penicillins     "childhood"  . Prednisone     fatigue  . Sulfa Antibiotics Nausea And Vomiting  . Theophyllines   . Ventolin [Albuterol]     tremor   Past Medical History  Diagnosis Date  . Pneumonia     multiple  . Need for pneumococcal vaccine     had vaccine several times  . Gastroesophageal reflux disease with hiatal hernia   . Asthma   . High cholesterol   . Breast cancer   . Hypertension   . Prediabetes   . Anxiety   . Depression   . COPD (chronic obstructive pulmonary disease)   . Vitamin D deficiency   . Osteoporosis    Review of Systems     Objective:   Physical Exam BP 140/76  Pulse 88  Temp98.2 F   Resp 16  Ht 5\' 3"    Wt 123 lb 9.6 oz   BMI 21.90 kg/m2  Patient appears in no distress.  Heent - neg  Neck - supple  Chest - Sl tenderness of the anterior lower chest wall bilateral. No respiratory distress. Abd - soft. Benign.  MS - Nl ROM of shoulders rt wrist and hand. Clean dry superficial 2 '' skin tear of the dorsal Rt hand w/o sign of  infection. Lt ankle secured with 4 ' ace wrap and only pain with lateral eversion of foot. Gait is normal w/o limp.  Assessment & Plan:   1- Contusion chest wall , minor 2- superficial laceration dorsal Rt wrist 3- Sprain Lt ankle   Daughter reassured and advised to return as needed if Sx's change or worsen.

## 2014-02-06 ENCOUNTER — Ambulatory Visit: Payer: Self-pay | Admitting: Internal Medicine

## 2014-03-07 ENCOUNTER — Encounter: Payer: Self-pay | Admitting: Physician Assistant

## 2014-03-07 ENCOUNTER — Ambulatory Visit (INDEPENDENT_AMBULATORY_CARE_PROVIDER_SITE_OTHER): Payer: Medicare Other | Admitting: Physician Assistant

## 2014-03-07 ENCOUNTER — Other Ambulatory Visit: Payer: Self-pay | Admitting: Physician Assistant

## 2014-03-07 ENCOUNTER — Ambulatory Visit (HOSPITAL_COMMUNITY)
Admission: RE | Admit: 2014-03-07 | Discharge: 2014-03-07 | Disposition: A | Discharge status: Home / Self Care | Payer: Medicare Other | Source: Ambulatory Visit | Attending: Physician Assistant | Admitting: Physician Assistant

## 2014-03-07 VITALS — BP 132/64 | HR 76 | Temp 98.4°F | Resp 16 | Wt 124.0 lb

## 2014-03-07 DIAGNOSIS — Z79899 Other long term (current) drug therapy: Secondary | ICD-10-CM | POA: Diagnosis not present

## 2014-03-07 DIAGNOSIS — Z0000 Encounter for general adult medical examination without abnormal findings: Secondary | ICD-10-CM | POA: Diagnosis not present

## 2014-03-07 DIAGNOSIS — R059 Cough, unspecified: Secondary | ICD-10-CM | POA: Diagnosis not present

## 2014-03-07 DIAGNOSIS — I700 Atherosclerosis of aorta: Secondary | ICD-10-CM | POA: Insufficient documentation

## 2014-03-07 DIAGNOSIS — K449 Diaphragmatic hernia without obstruction or gangrene: Secondary | ICD-10-CM | POA: Diagnosis not present

## 2014-03-07 DIAGNOSIS — G301 Alzheimer's disease with late onset: Secondary | ICD-10-CM

## 2014-03-07 DIAGNOSIS — J438 Other emphysema: Secondary | ICD-10-CM

## 2014-03-07 DIAGNOSIS — I10 Essential (primary) hypertension: Secondary | ICD-10-CM | POA: Diagnosis not present

## 2014-03-07 DIAGNOSIS — R062 Wheezing: Secondary | ICD-10-CM | POA: Diagnosis not present

## 2014-03-07 DIAGNOSIS — E782 Mixed hyperlipidemia: Secondary | ICD-10-CM

## 2014-03-07 DIAGNOSIS — Z1331 Encounter for screening for depression: Secondary | ICD-10-CM

## 2014-03-07 DIAGNOSIS — J8410 Pulmonary fibrosis, unspecified: Secondary | ICD-10-CM | POA: Diagnosis not present

## 2014-03-07 DIAGNOSIS — E559 Vitamin D deficiency, unspecified: Secondary | ICD-10-CM

## 2014-03-07 DIAGNOSIS — R05 Cough: Secondary | ICD-10-CM

## 2014-03-07 DIAGNOSIS — D649 Anemia, unspecified: Secondary | ICD-10-CM | POA: Diagnosis not present

## 2014-03-07 DIAGNOSIS — R7309 Other abnormal glucose: Secondary | ICD-10-CM | POA: Diagnosis not present

## 2014-03-07 DIAGNOSIS — F0280 Dementia in other diseases classified elsewhere without behavioral disturbance: Secondary | ICD-10-CM

## 2014-03-07 DIAGNOSIS — Z9181 History of falling: Secondary | ICD-10-CM

## 2014-03-07 DIAGNOSIS — R7303 Prediabetes: Secondary | ICD-10-CM

## 2014-03-07 LAB — CBC WITH DIFFERENTIAL/PLATELET
Basophils Absolute: 0 10*3/uL (ref 0.0–0.1)
Basophils Relative: 0 % (ref 0–1)
EOS ABS: 0.1 10*3/uL (ref 0.0–0.7)
Eosinophils Relative: 2 % (ref 0–5)
HEMATOCRIT: 26.8 % — AB (ref 36.0–46.0)
Hemoglobin: 8.6 g/dL — ABNORMAL LOW (ref 12.0–15.0)
LYMPHS ABS: 1.6 10*3/uL (ref 0.7–4.0)
LYMPHS PCT: 26 % (ref 12–46)
MCH: 24.5 pg — ABNORMAL LOW (ref 26.0–34.0)
MCHC: 32.1 g/dL (ref 30.0–36.0)
MCV: 76.4 fL — ABNORMAL LOW (ref 78.0–100.0)
MONO ABS: 0.6 10*3/uL (ref 0.1–1.0)
Monocytes Relative: 10 % (ref 3–12)
Neutro Abs: 3.8 10*3/uL (ref 1.7–7.7)
Neutrophils Relative %: 62 % (ref 43–77)
Platelets: 366 10*3/uL (ref 150–400)
RBC: 3.51 MIL/uL — AB (ref 3.87–5.11)
RDW: 17.9 % — AB (ref 11.5–15.5)
WBC: 6.1 10*3/uL (ref 4.0–10.5)

## 2014-03-07 LAB — HEPATIC FUNCTION PANEL
ALK PHOS: 57 U/L (ref 39–117)
ALT: 8 U/L (ref 0–35)
AST: 13 U/L (ref 0–37)
Albumin: 4 g/dL (ref 3.5–5.2)
Bilirubin, Direct: 0.1 mg/dL (ref 0.0–0.3)
Indirect Bilirubin: 0.4 mg/dL (ref 0.2–1.2)
Total Bilirubin: 0.5 mg/dL (ref 0.2–1.2)
Total Protein: 6.7 g/dL (ref 6.0–8.3)

## 2014-03-07 LAB — HEMOGLOBIN A1C
HEMOGLOBIN A1C: 5.9 % — AB (ref <5.7)
MEAN PLASMA GLUCOSE: 123 mg/dL — AB (ref <117)

## 2014-03-07 LAB — LIPID PANEL
CHOL/HDL RATIO: 2.5 ratio
Cholesterol: 212 mg/dL — ABNORMAL HIGH (ref 0–200)
HDL: 85 mg/dL (ref >39)
LDL Cholesterol: 105 mg/dL — ABNORMAL HIGH (ref 0–99)
Triglycerides: 111 mg/dL (ref <150)
VLDL: 22 mg/dL (ref 0–40)

## 2014-03-07 LAB — IRON AND TIBC
%SAT: 5 % — ABNORMAL LOW (ref 20–55)
IRON: 23 ug/dL — AB (ref 42–145)
TIBC: 435 ug/dL (ref 250–470)
UIBC: 412 ug/dL — ABNORMAL HIGH (ref 125–400)

## 2014-03-07 LAB — BASIC METABOLIC PANEL WITH GFR
BUN: 14 mg/dL (ref 6–23)
CHLORIDE: 102 meq/L (ref 96–112)
CO2: 25 mEq/L (ref 19–32)
Calcium: 9.4 mg/dL (ref 8.4–10.5)
Creat: 0.82 mg/dL (ref 0.50–1.10)
GFR, Est African American: 74 mL/min
GFR, Est Non African American: 65 mL/min
Glucose, Bld: 101 mg/dL — ABNORMAL HIGH (ref 70–99)
Potassium: 4.2 mEq/L (ref 3.5–5.3)
Sodium: 139 mEq/L (ref 135–145)

## 2014-03-07 LAB — VITAMIN B12: VITAMIN B 12: 384 pg/mL (ref 211–911)

## 2014-03-07 LAB — MAGNESIUM: Magnesium: 1.7 mg/dL (ref 1.5–2.5)

## 2014-03-07 LAB — FERRITIN: Ferritin: 8 ng/mL — ABNORMAL LOW (ref 10–291)

## 2014-03-07 LAB — TSH: TSH: 0.983 u[IU]/mL (ref 0.350–4.500)

## 2014-03-07 MED ORDER — PREDNISONE 20 MG PO TABS: ORAL_TABLET | ORAL | Status: DC

## 2014-03-07 MED ORDER — RANITIDINE HCL 150 MG PO TABS
150.0000 mg | ORAL_TABLET | Freq: Two times a day (BID) | ORAL | Status: DC
Start: 2014-03-07 — End: 2014-06-14

## 2014-03-07 MED ORDER — AZITHROMYCIN 250 MG PO TABS
ORAL_TABLET | ORAL | Status: DC
Start: 2014-03-07 — End: 2014-06-14

## 2014-03-07 NOTE — Patient Instructions (Signed)

## 2014-03-07 NOTE — Progress Notes (Signed)
MEDICARE ANNUAL WELLNESS VISIT AND FOLLOW UP  Assessment:   1. HYPERTENSION - CBC with Differential - BASIC METABOLIC PANEL WITH GFR - Hepatic function panel - TSH  2. EMPHYSEMA with cough, decreased breath sounds right lung, with holosystolic mumur Rule out pneumonia/CHF - DG Chest 2 View; Future -Zpak  3. Prediabetes Discussed general issues about diabetes pathophysiology and management., Educational material distributed., Suggested low cholesterol diet., Encouraged aerobic exercise., Discussed foot care., Reminded to get yearly retinal exam. - Hemoglobin A1c - HM DIABETES FOOT EXAM  4. SDAT Very supportive family, will focus on quality of life  5. Hyperlipidemia - Lipid panel  6. Vitamin D deficiency  7. Encounter for long-term (current) use of other medications - Magnesium   Plan:   During the course of the visit the patient was educated and counseled about appropriate screening and preventive services including:    Pneumococcal vaccine   Influenza vaccine  Td vaccine  Screening electrocardiogram  Screening mammography  Bone densitometry screening  Colorectal cancer screening  Diabetes screening  Glaucoma screening  Nutrition counseling   Advanced directives: given info/requested  Screening recommendations, referrals:  Vaccinations: Tdap vaccine not indicated Influenza vaccine not indicated Pneumococcal vaccine not indicated Shingles vaccine not indicated Hep B vaccine not indicated  Nutrition assessed and recommended  Colonoscopy declined Mammogram requested Pap smear not indicated Pelvic exam not indicated Recommended yearly ophthalmology/optometry visit for glaucoma screening and checkup Recommended yearly dental visit for hygiene and checkup Advanced directives - requested  Conditions/risks identified: BMI: Discussed weight loss, diet, and increase physical activity.  Increase physical activity: AHA recommends 150 minutes of  physical activity a week.  Medications reviewed DEXA- due 08/2014 Diabetes is at goal, ACE/ARB therapy: Yes. Urinary Incontinence is not an issue: discussed non pharmacology and pharmacology options.  Fall risk: moderate- discussed PT, home fall assessment, medications.    Subjective:   [name] is a 80 y.o. female who presents for Medicare Annual Wellness Visit and 3 month follow up on hypertension, prediabetes, hyperlipidemia, vitamin D def.  Date of last medicare wellness visit is unknown.   Her blood pressure has been controlled at home, today their BP is BP: 132/64 mmHg She has been off the diltiazem for 2 months, her legs have stopped swelling and her BP has been good at home.  She does not workout. She denies chest pain, shortness of breath, dizziness. Her son is here with her today. Very supportive family.  She is not on cholesterol medication and denies myalgias. Her cholesterol is at goal. The cholesterol last visit was:   Lab Results  Component Value Date   CHOL 203* 12/05/2013   HDL 61 12/05/2013   LDLCALC 84 12/05/2013   TRIG 289* 12/05/2013   CHOLHDL 3.3 12/05/2013   She has been working on diet and exercise for prediabetes, and denies polydipsia, polyuria and visual disturbances. Last A1C in the office was:  Lab Results  Component Value Date   HGBA1C 5.8* 12/05/2013   Patient is on Vitamin D supplement. She has had cold symptoms for 3-4 days, productive cough with yellow sputum, wheezing, denies fever, chills, CP, SOB.   Names of Other Physician/Practitioners you currently use: 1. [facility]- here for primary care 2. Dr. Einar Gip, eye doctor, last visit yearly 3. Wears dentures- no dentist Patient Care Team: Unk Pinto, MD as PCP - General (Internal Medicine) Minus Breeding, MD as Consulting Physician (Cardiology)  Medication Review Current Outpatient Prescriptions on File Prior to  Visit  Medication Sig Dispense  Refill  . azelastine (ASTELIN) 137 MCG/SPRAY nasal spray Use in each nostril as directed  30 mL  2   No current facility-administered medications on file prior to visit.    Current Problems (verified) Patient Active Problem List   Diagnosis Date Noted  . SDAT 01/30/2014  . Encounter for long-term (current) use of other medications 12/05/2013  . Hyperlipidemia   . Prediabetes   . Vitamin D deficiency   . HYPERTENSION 11/29/2010  . EMPHYSEMA 11/29/2010  . ASTHMA 11/29/2010    Screening Tests Health Maintenance  Topic Date Due  . Colonoscopy  10/09/2013  . Influenza Vaccine  04/29/2014  . Tetanus/tdap  09/07/2022  . Pneumococcal Polysaccharide Vaccine Age 46 And Over  Completed  . Zostavax  Completed     Immunization History  Administered Date(s) Administered  . Influenza-Unspecified 08/13/2013  . Pneumococcal-Unspecified 10/09/2006  . Td 09/08/2002, 09/07/2012  . Zoster 09/10/2012    Preventative care: Tetanus: 2013  Pneumovax: 2008  Prevnar 13: DUE Flu vaccine: 2014  Zostavax: 2013  Pap: 2001 negative declines MGM: 10/2013 normal (had [place] left breast normal)  DEXA: 08/2012 osteopenia due 08/2014 Colonoscopy: 2005  Will not get another due to age EGD: N/A  Echo 2011 mild MR, EF 55% Cardiolite negative 2008 CT chest 2012 CAD and COPD  History reviewed: allergies, current medications, past family history, past medical history, past social history, past surgical history and problem list  Risk Factors: Osteoporosis: postmenopausal estrogen deficiency and dietary calcium and/or vitamin D deficiency History of fracture in the past year: no  Tobacco History  Substance Use Topics  . Smoking status: Former Smoker -- 1.00 packs/day for 20 years    Types: Cigarettes    Quit date: 09/08/1998  . Smokeless tobacco: Never Used     Comment: quit 1988  . Alcohol Use: No   She does not smoke.  Patient is a former smoker. Are there smokers in your home (other than  you)?  No  Alcohol Current alcohol use: none  Caffeine Current caffeine use: occ  Exercise Current exercise: none but can walk   Nutrition/Diet Current diet: in general, a "healthy" diet    Cardiac risk factors: advanced age (older than 2 for men, 34 for women), dyslipidemia, family history of premature cardiovascular disease, hypertension and sedentary lifestyle.  Depression Screen (Note: if answer to either of the following is "Yes", a more complete depression screening is indicated)   Q1: Over the past two weeks, have you felt down, depressed or hopeless? No  Q2: Over the past two weeks, have you felt little interest or pleasure in doing things? No  Have you lost interest or pleasure in daily life? No  Do you often feel hopeless? No  Do you cry easily over simple problems? No  Activities of Daily Living In your present state of health, do you have any difficulty performing the following activities?:  Driving? Yes Managing money?  Yes Feeding yourself? No Getting from bed to chair? No Climbing a flight of stairs? Yes Preparing food and eating?: No Bathing or showering? No Getting dressed: No Getting to the toilet? No Using the toilet:No Moving around from place to place: No In the past year have you fallen or had a near fall?:Yes once this past year, no fracture, tripped over dog.    Are you sexually active?  No  Do you have more than one partner?  No  Vision Difficulties: No  Hearing Difficulties: Yes  Do you often ask people to speak up or repeat themselves? Yes Do you experience ringing or noises in your ears? No Do you have difficulty understanding soft or whispered voices? Yes  Cognition  Do you feel that you have a problem with memory?Yes  Do you often misplace items? Yes  Do you feel safe at home?  Yes  Advanced directives Does patient have a [place]? No Does patient have a Living Will? No   Objective:   Blood pressure  132/64, pulse 76, temperature 98.4 F (36.9 C), resp. rate 16, weight 124 lb (56.246 kg). Body mass index is 21.97 kg/(m^2). Wt Readings from Last 3 Encounters:  03/07/14 124 lb (56.246 kg)  01/30/14 123 lb 9.6 oz (56.065 kg)  12/05/13 130 lb 12.8 oz (59.33 kg)   General appearance: alert, no distress, WD/WN,  female Cognitive Testing  Alert? Yes  Normal Appearance?Yes  Oriented to person? Yes  Place? Yes   Time? Yes  Recall of three objects?  No  Can perform simple calculations? No  Displays appropriate judgment?Yes  Can read the correct time from a watch face?Yes  HEENT: normocephalic, sclerae anicteric, TMs pearly, nares patent, no discharge or erythema, pharynx normal Oral cavity: MMM, no lesions Neck: supple, no lymphadenopathy, no thyromegaly, no masses Heart: RRR, holosystolic murmur with mid clickbest at LSB with prominate S2 Lungs: right lower lung to mid lung with decreased breath sounds and questionable wheezing/rales.  Abdomen: +bs, soft, non tender, non distended, no masses, no hepatomegaly, no splenomegaly Musculoskeletal: nontender, no swelling, no obvious deformity Extremities: 1+  edema, no cyanosis, no clubbing Pulses: 2+ symmetric, upper and lower extremities, normal cap refill Neurological: alert, oriented x 3, CN2-12 intact, strength normal upper extremities and lower extremities, sensation normal throughout, DTRs 2+ throughout, no cerebellar signs, gait normal Psychiatric: normal affect, behavior normal, pleasant  Breast: defer Gyn: defer Rectal: defer  Medicare Attestation I have personally reviewed: The patient's medical and social history Their use of alcohol, tobacco or illicit drugs Their current medications and supplements The patient's functional ability including ADLs,fall risks, home safety risks, cognitive, and hearing and visual impairment Diet and physical activities Evidence for depression or mood disorders  The patient's weight, height,  BMI, and visual acuity have been recorded in the chart.  I have made referrals, counseling, and provided education to the patient based on review of the above and I have provided the patient with a written personalized care plan for preventive services.     Vicie Mutters, PA-C   03/07/2014

## 2014-03-08 ENCOUNTER — Telehealth: Payer: Self-pay | Admitting: Physician Assistant

## 2014-03-08 NOTE — Telephone Encounter (Signed)
Discussed drop in H/H with son. Patient is not complaining of any AB pain, nausea, weakness, SOB, CP. Due to her age he would prefer to be very conservative with her and not refer her to GI. We are switching from nexium to Zantac BID, she will get on Iron 2-3 times a week with Vitamin C and follow up here in 1 month for repeat CBC. We have sent a stool card.

## 2014-04-07 ENCOUNTER — Ambulatory Visit (INDEPENDENT_AMBULATORY_CARE_PROVIDER_SITE_OTHER): Payer: Medicare Other

## 2014-04-07 ENCOUNTER — Ambulatory Visit: Payer: Self-pay

## 2014-04-07 DIAGNOSIS — R6889 Other general symptoms and signs: Secondary | ICD-10-CM

## 2014-04-07 DIAGNOSIS — D649 Anemia, unspecified: Secondary | ICD-10-CM | POA: Diagnosis not present

## 2014-04-07 NOTE — Progress Notes (Signed)
Patient ID: [name], female   DOB: 01/25/1925, 80 y.o.   MRN: [id number] Patient here today for recheck of abnormal labs. Patients son Remo Lipps states that she is taking her iron 325 mg one in a.m. , and one in p.m. Remo Lipps also states that she did receive the hemoccult card but he advises that she will probably not return it. He also asked that he be contact person as he states she will forget appointments and also cancel them without his knowledge. Phone number updated to his.

## 2014-04-08 LAB — CBC WITH DIFFERENTIAL/PLATELET
Basophils Absolute: 0 10*3/uL (ref 0.0–0.1)
Basophils Relative: 1 % (ref 0–1)
EOS ABS: 0.1 10*3/uL (ref 0.0–0.7)
Eosinophils Relative: 3 % (ref 0–5)
HCT: 33.3 % — ABNORMAL LOW (ref 36.0–46.0)
HEMOGLOBIN: 10.4 g/dL — AB (ref 12.0–15.0)
LYMPHS ABS: 1.9 10*3/uL (ref 0.7–4.0)
Lymphocytes Relative: 45 % (ref 12–46)
MCH: 26.1 pg (ref 26.0–34.0)
MCHC: 31.2 g/dL (ref 30.0–36.0)
MCV: 83.5 fL (ref 78.0–100.0)
MONOS PCT: 9 % (ref 3–12)
Monocytes Absolute: 0.4 10*3/uL (ref 0.1–1.0)
Neutro Abs: 1.8 10*3/uL (ref 1.7–7.7)
Neutrophils Relative %: 42 % — ABNORMAL LOW (ref 43–77)
Platelets: 281 10*3/uL (ref 150–400)
RBC: 3.99 MIL/uL (ref 3.87–5.11)
RDW: 21 % — ABNORMAL HIGH (ref 11.5–15.5)
WBC: 4.3 10*3/uL (ref 4.0–10.5)

## 2014-04-08 LAB — FERRITIN: FERRITIN: 21 ng/mL (ref 10–291)

## 2014-04-08 LAB — IRON: Iron: 24 ug/dL — ABNORMAL LOW (ref 42–145)

## 2014-04-13 ENCOUNTER — Ambulatory Visit: Payer: Self-pay

## 2014-06-08 ENCOUNTER — Ambulatory Visit: Payer: Self-pay | Admitting: Emergency Medicine

## 2014-06-14 ENCOUNTER — Encounter: Payer: Self-pay | Admitting: Emergency Medicine

## 2014-06-14 ENCOUNTER — Ambulatory Visit (INDEPENDENT_AMBULATORY_CARE_PROVIDER_SITE_OTHER): Payer: Medicare Other | Admitting: Emergency Medicine

## 2014-06-14 VITALS — BP 152/70 | HR 72 | Temp 98.0°F | Resp 16 | Ht 63.0 in | Wt 120.0 lb

## 2014-06-14 DIAGNOSIS — E782 Mixed hyperlipidemia: Secondary | ICD-10-CM

## 2014-06-14 DIAGNOSIS — D649 Anemia, unspecified: Secondary | ICD-10-CM

## 2014-06-14 DIAGNOSIS — I10 Essential (primary) hypertension: Secondary | ICD-10-CM | POA: Diagnosis not present

## 2014-06-14 DIAGNOSIS — R7309 Other abnormal glucose: Secondary | ICD-10-CM | POA: Diagnosis not present

## 2014-06-14 DIAGNOSIS — Z23 Encounter for immunization: Secondary | ICD-10-CM | POA: Diagnosis not present

## 2014-06-14 LAB — CBC WITH DIFFERENTIAL/PLATELET
BASOS PCT: 0 % (ref 0–1)
Basophils Absolute: 0 10*3/uL (ref 0.0–0.1)
EOS PCT: 2 % (ref 0–5)
Eosinophils Absolute: 0.1 10*3/uL (ref 0.0–0.7)
HCT: 34.8 % — ABNORMAL LOW (ref 36.0–46.0)
Hemoglobin: 11.6 g/dL — ABNORMAL LOW (ref 12.0–15.0)
Lymphocytes Relative: 31 % (ref 12–46)
Lymphs Abs: 1.8 10*3/uL (ref 0.7–4.0)
MCH: 30.2 pg (ref 26.0–34.0)
MCHC: 33.3 g/dL (ref 30.0–36.0)
MCV: 90.6 fL (ref 78.0–100.0)
Monocytes Absolute: 0.5 10*3/uL (ref 0.1–1.0)
Monocytes Relative: 9 % (ref 3–12)
NEUTROS PCT: 58 % (ref 43–77)
Neutro Abs: 3.3 10*3/uL (ref 1.7–7.7)
PLATELETS: 264 10*3/uL (ref 150–400)
RBC: 3.84 MIL/uL — ABNORMAL LOW (ref 3.87–5.11)
RDW: 18.5 % — ABNORMAL HIGH (ref 11.5–15.5)
WBC: 5.7 10*3/uL (ref 4.0–10.5)

## 2014-06-14 LAB — HEPATIC FUNCTION PANEL
ALT: 8 U/L (ref 0–35)
AST: 18 U/L (ref 0–37)
Albumin: 4 g/dL (ref 3.5–5.2)
Alkaline Phosphatase: 49 U/L (ref 39–117)
BILIRUBIN DIRECT: 0.1 mg/dL (ref 0.0–0.3)
BILIRUBIN INDIRECT: 0.7 mg/dL (ref 0.2–1.2)
Total Bilirubin: 0.8 mg/dL (ref 0.2–1.2)
Total Protein: 6.1 g/dL (ref 6.0–8.3)

## 2014-06-14 LAB — BASIC METABOLIC PANEL WITH GFR
BUN: 9 mg/dL (ref 6–23)
CALCIUM: 9.1 mg/dL (ref 8.4–10.5)
CO2: 25 mEq/L (ref 19–32)
Chloride: 94 mEq/L — ABNORMAL LOW (ref 96–112)
Creat: 0.74 mg/dL (ref 0.50–1.10)
GFR, EST AFRICAN AMERICAN: 84 mL/min
GFR, EST NON AFRICAN AMERICAN: 73 mL/min
Glucose, Bld: 96 mg/dL (ref 70–99)
Potassium: 4.1 mEq/L (ref 3.5–5.3)
Sodium: 130 mEq/L — ABNORMAL LOW (ref 135–145)

## 2014-06-14 LAB — IRON AND TIBC
%SAT: 35 % (ref 20–55)
IRON: 117 ug/dL (ref 42–145)
TIBC: 337 ug/dL (ref 250–470)
UIBC: 220 ug/dL (ref 125–400)

## 2014-06-14 LAB — LIPID PANEL
CHOL/HDL RATIO: 3.7 ratio
Cholesterol: 221 mg/dL — ABNORMAL HIGH (ref 0–200)
HDL: 60 mg/dL (ref >39)
LDL CALC: 124 mg/dL — AB (ref 0–99)
Triglycerides: 186 mg/dL — ABNORMAL HIGH (ref <150)
VLDL: 37 mg/dL (ref 0–40)

## 2014-06-14 NOTE — Patient Instructions (Signed)

## 2014-06-14 NOTE — Progress Notes (Signed)
Subjective:    Patient ID: [name], female    DOB: 05/05/1925, 80 y.o.   MRN: [id number]  HPI Comments: 80 yo  WF presents for 3 month F/U for HTN, Cholesterol, Pre-Dm, D. Deficient. She has decreased appetite and eats small portions. She eats ice cream/ jimmy dean sausage biscuits on routine basis. She is staying a little busy but denies cardio. Son notes BP ok at home.  She also needs f/u for iron, she notes dark stools with Iron tablets but is feeling a little better with supplements.  WBC      4.3   04/07/2014 HGB     10.4   04/07/2014 HCT     33.3   04/07/2014 MCV     83.5   04/07/2014 PLT      281   04/07/2014  IRON           24   04/07/2014 TIBC          435   03/07/2014 FERRITIN       21   04/07/2014  WBC             4.3   04/07/2014 HGB            10.4   04/07/2014 HCT            33.3   04/07/2014 PLT             281   04/07/2014 GLUCOSE         101   03/07/2014 CHOL            212   03/07/2014 TRIG            111   03/07/2014 HDL              85   03/07/2014  LDLCALC         105   03/07/2014 ALT               8   03/07/2014 AST              13   03/07/2014 NA              139   03/07/2014 K               4.2   03/07/2014 CL              102   03/07/2014 CREATININE     0.82   03/07/2014 BUN              14   03/07/2014 CO2              25   03/07/2014 TSH           0.983   03/07/2014 INR            0.91   11/17/2010 HGBA1C          5.9   03/07/2014 MICROALBUR     0.96   09/12/2013      Medication List       This list is accurate as of: 06/14/14 11:59 PM.  Always use your most recent med list.               ferrous sulfate 325 (65 FE) MG tablet  Take 325 mg by mouth daily with breakfast.     Vitamin D-3 5000 UNITS Tabs  Take 5,000 Units by mouth daily.  Allergies  Allergen Reactions  . Codeine Nausea And Vomiting  . Desyrel [Trazodone]   . Penicillins     "childhood"  . Prednisone     fatigue  . Sulfa Antibiotics Nausea And Vomiting  . Theophyllines   . Ventolin  [Albuterol]     tremor   Past Medical History  Diagnosis Date  . Pneumonia     multiple  . Need for pneumococcal vaccine     had vaccine several times  . Gastroesophageal reflux disease with hiatal hernia   . Asthma   . High cholesterol   . Breast cancer   . Hypertension   . Prediabetes   . Anxiety   . Depression   . COPD (chronic obstructive pulmonary disease)   . Vitamin D deficiency   . Osteoporosis      Review of Systems  Constitutional: Positive for appetite change.  Cardiovascular: Negative for chest pain.  Gastrointestinal:       Dark stools with iron  All other systems reviewed and are negative.  BP 152/70  Pulse 72  Temp(Src) 98 F (36.7 C) (Temporal)  Resp 16  Ht 5\' 3"  (1.6 m)  Wt 120 lb (54.432 kg)  BMI 21.26 kg/m2     Objective:   Physical Exam  Nursing note and vitals reviewed. Constitutional: She is oriented to person, place, and time. She appears well-developed and well-nourished. No distress.  thin  HENT:  Head: Normocephalic and atraumatic.  Right Ear: External ear normal.  Left Ear: External ear normal.  Nose: Nose normal.  Mouth/Throat: Oropharynx is clear and moist.  Eyes: Conjunctivae and EOM are normal.  Neck: Normal range of motion. Neck supple. No JVD present. No thyromegaly present.  Cardiovascular: Normal rate, regular rhythm, normal heart sounds and intact distal pulses.   Pulmonary/Chest: Effort normal and breath sounds normal.  Abdominal: Soft. Bowel sounds are normal. She exhibits no distension. There is no tenderness.  Musculoskeletal: Normal range of motion. She exhibits no edema and no tenderness.  Lymphadenopathy:    She has no cervical adenopathy.  Neurological: She is alert and oriented to person, place, and time. No cranial nerve deficit.  Skin: Skin is warm and dry. No rash noted. No erythema. There is pallor.  Conjunctiva, nail beds  Psychiatric: She has a normal mood and affect. Her behavior is normal. Judgment and  thought content normal.          Assessment & Plan:  1.  3 month F/U for HTN, Cholesterol, Pre-Dm, D. Deficient. Needs healthy diet, cardio QD and obtain healthy weight. Check Labs, Check BP if >130/80 call office   2. Anemia- Recheck labs, take Vitamins AD increase green leafy veggies, increase H2O, w/c if any symptom increase.   3. Update flu vaccine

## 2014-06-15 LAB — HEMOGLOBIN A1C
HEMOGLOBIN A1C: 5.8 % — AB (ref <5.7)
Mean Plasma Glucose: 120 mg/dL — ABNORMAL HIGH (ref <117)

## 2014-07-18 ENCOUNTER — Ambulatory Visit: Payer: Medicare Other

## 2014-07-18 DIAGNOSIS — R6889 Other general symptoms and signs: Secondary | ICD-10-CM

## 2014-07-18 LAB — BASIC METABOLIC PANEL WITH GFR
BUN: 13 mg/dL (ref 6–23)
CALCIUM: 9.9 mg/dL (ref 8.4–10.5)
CHLORIDE: 99 meq/L (ref 96–112)
CO2: 29 meq/L (ref 19–32)
Creat: 0.81 mg/dL (ref 0.50–1.10)
GFR, Est African American: 76 mL/min
GFR, Est Non African American: 66 mL/min
Glucose, Bld: 106 mg/dL — ABNORMAL HIGH (ref 70–99)
Potassium: 4 mEq/L (ref 3.5–5.3)
SODIUM: 138 meq/L (ref 135–145)

## 2014-07-21 ENCOUNTER — Other Ambulatory Visit: Payer: Self-pay | Admitting: Emergency Medicine

## 2014-07-24 ENCOUNTER — Ambulatory Visit (INDEPENDENT_AMBULATORY_CARE_PROVIDER_SITE_OTHER): Payer: Medicare Other | Admitting: Internal Medicine

## 2014-07-24 ENCOUNTER — Encounter: Payer: Self-pay | Admitting: Internal Medicine

## 2014-07-24 VITALS — BP 130/64 | HR 78 | Temp 98.0°F | Resp 16 | Ht 63.0 in | Wt 116.0 lb

## 2014-07-24 DIAGNOSIS — J209 Acute bronchitis, unspecified: Secondary | ICD-10-CM | POA: Diagnosis not present

## 2014-07-24 MED ORDER — PREDNISONE 20 MG PO TABS: ORAL_TABLET | ORAL | Status: DC

## 2014-07-24 MED ORDER — LEVOFLOXACIN 500 MG PO TABS
500.0000 mg | ORAL_TABLET | Freq: Every day | ORAL | Status: DC
Start: 2014-07-24 — End: 2015-04-05

## 2014-07-24 NOTE — Progress Notes (Signed)
   Subjective:    Patient ID: [name], female    DOB: 1924-11-08, 80 y.o.   MRN: [id number]  HPI  Presents with 4-5 day hx/o head/chest congestion , cough productive of sa green sputum. No fever, chills or dyspnea. [place] - reviewed & unchanged  Review of Systems  In addition to the HPI above,  No Fever-chills,  No Headache,  No problems swallowing food or Liquids,  No Chest pain  or Shortness of Breath,  No Abdominal pain, No Nausea or Vomitting,  No new skin rashes or bruises,  No new joints pains-aches,  No new weakness, tingling, numbness in any extremity,  No polyuria, polydypsia or polyphagia,  A full 10 point Review of Systems was done, except as stated above, all other Review of Systems were negative  Objective:   Physical Exam  BP 130/64  Pulse 78  Temp(Src) 98 F (36.7 C) (Temporal)  Resp 16  Ht 5\' 3"  (1.6 m)  Wt 116 lb (52.617 kg)  BMI 20.55 kg/m2  No Distress. Congested cough. HEENT - Eac's patent. TM's Nl. EOM's full. PERRLA. NasoOroPharynx clear. Neck - Supple. Nl Thyroid. Carotids 2+ & No bruits, nodes, JVD Chest - Decreased BS w/scattered  Rales and  Rhonchi but no wheezes. Cor - Nl HS. RRR w/o sig M. No edema. Abd - Benign, soft and No tenderness. BS nl. MS- FROM w/o deformities. Muscle power, tone and bulk Nl. Gait Nl. Neuro - No obvious Cr N abnormalities. Sensory, motor and Cerebellar functions appear Nl w/o focal abnormalities. Psyche - Mental status normal & appropriate.  No delusions, ideations or obvious mood abnormalities.  Assessment & Plan:   1. Acute bronchitis, unspecified organism   Rx Levaquin 500 mg qd x 10 days, Prednisone taper

## 2014-07-24 NOTE — Patient Instructions (Signed)
  Mucus Relief 400 mg  Take 2 tablets 3 x day with meals

## 2014-09-13 ENCOUNTER — Encounter: Payer: Self-pay | Admitting: Physician Assistant

## 2015-01-29 ENCOUNTER — Other Ambulatory Visit: Payer: Self-pay | Admitting: *Deleted

## 2015-01-29 DIAGNOSIS — Z1212 Encounter for screening for malignant neoplasm of rectum: Secondary | ICD-10-CM

## 2015-01-29 LAB — POC HEMOCCULT BLD/STL (HOME/3-CARD/SCREEN)
Card #2 Fecal Occult Blod, POC: NEGATIVE
Card #3 Fecal Occult Blood, POC: NEGATIVE
Fecal Occult Blood, POC: NEGATIVE

## 2015-04-05 ENCOUNTER — Encounter: Payer: Self-pay | Admitting: Physician Assistant

## 2015-04-05 ENCOUNTER — Ambulatory Visit (INDEPENDENT_AMBULATORY_CARE_PROVIDER_SITE_OTHER): Payer: Medicare Other | Admitting: Physician Assistant

## 2015-04-05 VITALS — BP 138/70 | HR 76 | Temp 98.1°F | Resp 16 | Ht 63.0 in | Wt 116.0 lb

## 2015-04-05 DIAGNOSIS — D649 Anemia, unspecified: Secondary | ICD-10-CM

## 2015-04-05 DIAGNOSIS — R6889 Other general symptoms and signs: Secondary | ICD-10-CM

## 2015-04-05 DIAGNOSIS — J45909 Unspecified asthma, uncomplicated: Secondary | ICD-10-CM

## 2015-04-05 DIAGNOSIS — E782 Mixed hyperlipidemia: Secondary | ICD-10-CM | POA: Diagnosis not present

## 2015-04-05 DIAGNOSIS — R0602 Shortness of breath: Secondary | ICD-10-CM | POA: Diagnosis not present

## 2015-04-05 DIAGNOSIS — Z79899 Other long term (current) drug therapy: Secondary | ICD-10-CM | POA: Diagnosis not present

## 2015-04-05 DIAGNOSIS — I10 Essential (primary) hypertension: Secondary | ICD-10-CM | POA: Diagnosis not present

## 2015-04-05 DIAGNOSIS — E559 Vitamin D deficiency, unspecified: Secondary | ICD-10-CM

## 2015-04-05 DIAGNOSIS — Z9181 History of falling: Secondary | ICD-10-CM

## 2015-04-05 DIAGNOSIS — R7303 Prediabetes: Secondary | ICD-10-CM

## 2015-04-05 DIAGNOSIS — Z0001 Encounter for general adult medical examination with abnormal findings: Secondary | ICD-10-CM | POA: Diagnosis not present

## 2015-04-05 DIAGNOSIS — F0280 Dementia in other diseases classified elsewhere without behavioral disturbance: Secondary | ICD-10-CM

## 2015-04-05 DIAGNOSIS — J438 Other emphysema: Secondary | ICD-10-CM

## 2015-04-05 DIAGNOSIS — R350 Frequency of micturition: Secondary | ICD-10-CM

## 2015-04-05 DIAGNOSIS — R7309 Other abnormal glucose: Secondary | ICD-10-CM | POA: Diagnosis not present

## 2015-04-05 DIAGNOSIS — G301 Alzheimer's disease with late onset: Secondary | ICD-10-CM

## 2015-04-05 DIAGNOSIS — M8580 Other specified disorders of bone density and structure, unspecified site: Secondary | ICD-10-CM

## 2015-04-05 DIAGNOSIS — I700 Atherosclerosis of aorta: Secondary | ICD-10-CM | POA: Insufficient documentation

## 2015-04-05 LAB — TSH: TSH: 0.611 u[IU]/mL (ref 0.350–4.500)

## 2015-04-05 LAB — CBC WITH DIFFERENTIAL/PLATELET
BASOS ABS: 0 10*3/uL (ref 0.0–0.1)
Basophils Relative: 0 % (ref 0–1)
Eosinophils Absolute: 0.1 10*3/uL (ref 0.0–0.7)
Eosinophils Relative: 1 % (ref 0–5)
HCT: 38.4 % (ref 36.0–46.0)
HEMOGLOBIN: 13 g/dL (ref 12.0–15.0)
LYMPHS ABS: 2.1 10*3/uL (ref 0.7–4.0)
LYMPHS PCT: 29 % (ref 12–46)
MCH: 32.7 pg (ref 26.0–34.0)
MCHC: 33.9 g/dL (ref 30.0–36.0)
MCV: 96.5 fL (ref 78.0–100.0)
MPV: 9.8 fL (ref 8.6–12.4)
Monocytes Absolute: 0.6 10*3/uL (ref 0.1–1.0)
Monocytes Relative: 8 % (ref 3–12)
Neutro Abs: 4.5 10*3/uL (ref 1.7–7.7)
Neutrophils Relative %: 62 % (ref 43–77)
Platelets: 279 10*3/uL (ref 150–400)
RBC: 3.98 MIL/uL (ref 3.87–5.11)
RDW: 13.5 % (ref 11.5–15.5)
WBC: 7.2 10*3/uL (ref 4.0–10.5)

## 2015-04-05 LAB — HEPATIC FUNCTION PANEL
ALBUMIN: 4.1 g/dL (ref 3.5–5.2)
ALK PHOS: 57 U/L (ref 39–117)
AST: 14 U/L (ref 0–37)
BILIRUBIN DIRECT: 0.1 mg/dL (ref 0.0–0.3)
BILIRUBIN INDIRECT: 0.6 mg/dL (ref 0.2–1.2)
BILIRUBIN TOTAL: 0.7 mg/dL (ref 0.2–1.2)
TOTAL PROTEIN: 6.5 g/dL (ref 6.0–8.3)

## 2015-04-05 LAB — IRON AND TIBC
%SAT: 21 % (ref 20–55)
Iron: 71 ug/dL (ref 42–145)
TIBC: 339 ug/dL (ref 250–470)
UIBC: 268 ug/dL (ref 125–400)

## 2015-04-05 LAB — BASIC METABOLIC PANEL WITH GFR
BUN: 13 mg/dL (ref 6–23)
CO2: 26 mEq/L (ref 19–32)
Calcium: 9.4 mg/dL (ref 8.4–10.5)
Chloride: 100 mEq/L (ref 96–112)
Creat: 0.83 mg/dL (ref 0.50–1.10)
GFR, EST NON AFRICAN AMERICAN: 63 mL/min
GFR, Est African American: 73 mL/min
Glucose, Bld: 101 mg/dL — ABNORMAL HIGH (ref 70–99)
Potassium: 4 mEq/L (ref 3.5–5.3)
SODIUM: 141 meq/L (ref 135–145)

## 2015-04-05 LAB — LIPID PANEL
CHOL/HDL RATIO: 3.2 ratio
Cholesterol: 224 mg/dL — ABNORMAL HIGH (ref 0–200)
HDL: 71 mg/dL (ref >=46)
LDL Cholesterol: 121 mg/dL — ABNORMAL HIGH (ref 0–99)
Triglycerides: 162 mg/dL — ABNORMAL HIGH (ref <150)
VLDL: 32 mg/dL (ref 0–40)

## 2015-04-05 LAB — HEMOGLOBIN A1C
Hgb A1c MFr Bld: 5.6 % (ref <5.7)
MEAN PLASMA GLUCOSE: 114 mg/dL (ref <117)

## 2015-04-05 LAB — FERRITIN: Ferritin: 45 ng/mL (ref 10–291)

## 2015-04-05 LAB — VITAMIN B12: Vitamin B-12: 368 pg/mL (ref 211–911)

## 2015-04-05 LAB — MAGNESIUM: MAGNESIUM: 1.8 mg/dL (ref 1.5–2.5)

## 2015-04-05 MED ORDER — AZITHROMYCIN 250 MG PO TABS: ORAL_TABLET | ORAL | Status: AC

## 2015-04-05 MED ORDER — MOMETASONE FURO-FORMOTEROL FUM 100-5 MCG/ACT IN AERO: 2.0000 | INHALATION_SPRAY | Freq: Two times a day (BID) | RESPIRATORY_TRACT | Status: DC

## 2015-04-05 NOTE — Patient Instructions (Addendum)
Chronic Obstructive Pulmonary Disease Chronic obstructive pulmonary disease (COPD) is a common lung condition in which airflow from the lungs is limited. COPD is a general term that can be used to describe many different lung problems that limit airflow, including both chronic bronchitis and emphysema. If you have COPD, your lung function will probably never return to normal, but there are measures you can take to improve lung function and make yourself feel better.  CAUSES   Smoking (common).   Exposure to secondhand smoke.   Genetic problems.  Chronic inflammatory lung diseases or recurrent infections. SYMPTOMS   Shortness of breath, especially with physical activity.   Deep, persistent (chronic) cough with a large amount of thick mucus.   Wheezing.   Rapid breaths (tachypnea).   Gray or bluish discoloration (cyanosis) of the skin, especially in fingers, toes, or lips.   Fatigue.   Weight loss.   Frequent infections or episodes when breathing symptoms become much worse (exacerbations).   Chest tightness. DIAGNOSIS  Your health care provider will take a medical history and perform a physical examination to make the initial diagnosis. Additional tests for COPD may include:   Lung (pulmonary) function tests.  Chest X-ray.  CT scan.  Blood tests. TREATMENT  Treatment available to help you feel better when you have COPD includes:   Inhaler and nebulizer medicines. These help manage the symptoms of COPD and make your breathing more comfortable.  Supplemental oxygen. Supplemental oxygen is only helpful if you have a low oxygen level in your blood.   Exercise and physical activity. These are beneficial for nearly all people with COPD. Some people may also benefit from a pulmonary rehabilitation program. HOME CARE INSTRUCTIONS   Take all medicines (inhaled or pills) as directed by your health care provider.  Avoid over-the-counter medicines or cough syrups  that dry up your airway (such as antihistamines) and slow down the elimination of secretions unless instructed otherwise by your health care provider.   If you are a smoker, the most important thing that you can do is stop smoking. Continuing to smoke will cause further lung damage and breathing trouble. Ask your health care provider for help with quitting smoking. He or she can direct you to community resources or hospitals that provide support.  Avoid exposure to irritants such as smoke, chemicals, and fumes that aggravate your breathing.  Use oxygen therapy and pulmonary rehabilitation if directed by your health care provider. If you require home oxygen therapy, ask your health care provider whether you should purchase a pulse oximeter to measure your oxygen level at home.   Avoid contact with individuals who have a contagious illness.  Avoid extreme temperature and humidity changes.  Eat healthy foods. Eating smaller, more frequent meals and resting before meals may help you maintain your strength.  Stay active, but balance activity with periods of rest. Exercise and physical activity will help you maintain your ability to do things you want to do.  Preventing infection and hospitalization is very important when you have COPD. Make sure to receive all the vaccines your health care provider recommends, especially the pneumococcal and influenza vaccines. Ask your health care provider whether you need a pneumonia vaccine.  Learn and use relaxation techniques to manage stress.  Learn and use controlled breathing techniques as directed by your health care provider. Controlled breathing techniques include:   Pursed lip breathing. Start by breathing in (inhaling) through your nose for 1 second. Then, purse your lips as if you were  going to whistle and breathe out (exhale) through the pursed lips for 2 seconds.   Diaphragmatic breathing. Start by putting one hand on your abdomen just above  your waist. Inhale slowly through your nose. The hand on your abdomen should move out. Then purse your lips and exhale slowly. You should be able to feel the hand on your abdomen moving in as you exhale.   Learn and use controlled coughing to clear mucus from your lungs. Controlled coughing is a series of short, progressive coughs. The steps of controlled coughing are:  1. Lean your head slightly forward.  2. Breathe in deeply using diaphragmatic breathing.  3. Try to hold your breath for 3 seconds.  4. Keep your mouth slightly open while coughing twice.  5. Spit any mucus out into a tissue.  6. Rest and repeat the steps once or twice as needed. SEEK MEDICAL CARE IF:   You are coughing up more mucus than usual.   There is a change in the color or thickness of your mucus.   Your breathing is more labored than usual.   Your breathing is faster than usual.  SEEK IMMEDIATE MEDICAL CARE IF:   You have shortness of breath while you are resting.   You have shortness of breath that prevents you from:  Being able to talk.   Performing your usual physical activities.   You have chest pain lasting longer than 5 minutes.   Your skin color is more cyanotic than usual.  You measure low oxygen saturations for longer than 5 minutes with a pulse oximeter. MAKE SURE YOU:   Understand these instructions.  Will watch your condition.  Will get help right away if you are not doing well or get worse. Document Released: 06/25/2005 Document Revised: 01/30/2014 Document Reviewed: 05/12/2013 [facility] Patient Information 2015 [place], [place]. This information is not intended to replace advice given to you by your health care provider. Make sure you discuss any questions you have with your health care provider.  Preventive Care for Adults A healthy lifestyle and preventive care can promote health and wellness. Preventive health guidelines for women include the following key  practices.  A routine yearly physical is a good way to check with your health care provider about your health and preventive screening. It is a chance to share any concerns and updates on your health and to receive a thorough exam.  Visit your dentist for a routine exam and preventive care every 6 months. Brush your teeth twice a day and floss once a day. Good oral hygiene prevents tooth decay and gum disease.  The frequency of eye exams is based on your age, health, family medical history, use of contact lenses, and other factors. Follow your health care provider's recommendations for frequency of eye exams.  Eat a healthy diet. Foods like vegetables, fruits, whole grains, low-fat dairy products, and lean protein foods contain the nutrients you need without too many calories. Decrease your intake of foods high in solid fats, added sugars, and salt. Eat the right amount of calories for you.Get information about a proper diet from your health care provider, if necessary.  Regular physical exercise is one of the most important things you can do for your health. Most adults should get at least 150 minutes of moderate-intensity exercise (any activity that increases your heart rate and causes you to sweat) each week. In addition, most adults need muscle-strengthening exercises on 2 or more days a week.  Maintain a healthy weight.  The body mass index (BMI) is a screening tool to identify possible weight problems. It provides an estimate of body fat based on height and weight. Your health care provider can find your BMI and can help you achieve or maintain a healthy weight.For adults 20 years and older:  A BMI below 18.5 is considered underweight.  A BMI of 18.5 to 24.9 is normal.  A BMI of 25 to 29.9 is considered overweight.  A BMI of 30 and above is considered obese.  Maintain normal blood lipids and cholesterol levels by exercising and minimizing your intake of saturated fat. Eat a balanced diet  with plenty of fruit and vegetables. If your lipid or cholesterol levels are high, you are over 50, or you are at high risk for heart disease, you may need your cholesterol levels checked more frequently.Ongoing high lipid and cholesterol levels should be treated with medicines if diet and exercise are not working.  If you smoke, find out from your health care provider how to quit. If you do not use tobacco, do not start.  Lung cancer screening is recommended for adults aged 39-80 years who are at high risk for developing lung cancer because of a history of smoking. A yearly low-dose CT scan of the lungs is recommended for people who have at least a 30-pack-year history of smoking and are a current smoker or have quit within the past 15 years. A pack year of smoking is smoking an average of 1 pack of cigarettes a day for 1 year (for example: 1 pack a day for 30 years or 2 packs a day for 15 years). Yearly screening should continue until the smoker has stopped smoking for at least 15 years. Yearly screening should be stopped for people who develop a health problem that would prevent them from having lung cancer treatment.  Avoid use of street drugs. Do not share needles with anyone. Ask for help if you need support or instructions about stopping the use of drugs.  High blood pressure causes heart disease and increases the risk of stroke.  Ongoing high blood pressure should be treated with medicines if weight loss and exercise do not work.  If you are 27-45 years old, ask your health care provider if you should take aspirin to prevent strokes.  Diabetes screening involves taking a blood sample to check your fasting blood sugar level. This should be done once every 3 years, after age 79, if you are within normal weight and without risk factors for diabetes. Testing should be considered at a younger age or be carried out more frequently if you are overweight and have at least 1 risk factor for  diabetes.  Breast cancer screening is essential preventive care for women. You should practice "breast self-awareness." This means understanding the normal appearance and feel of your breasts and may include breast self-examination. Any changes detected, no matter how small, should be reported to a health care provider. Women in their 28s and 30s should have a clinical breast exam (CBE) by a health care provider as part of a regular health exam every 1 to 3 years. After age 59, women should have a CBE every year. Starting at age 16, women should consider having a mammogram (breast X-ray test) every year. Women who have a family history of breast cancer should talk to their health care provider about genetic screening. Women at a high risk of breast cancer should talk to their health care providers about having an MRI and  a mammogram every year.  Breast cancer gene (BRCA)-related cancer risk assessment is recommended for women who have family members with BRCA-related cancers. BRCA-related cancers include breast, ovarian, tubal, and peritoneal cancers. Having family members with these cancers may be associated with an increased risk for harmful changes (mutations) in the breast cancer genes BRCA1 and BRCA2. Results of the assessment will determine the need for genetic counseling and BRCA1 and BRCA2 testing.  Routine pelvic exams to screen for cancer are no longer recommended for nonpregnant women who are considered low risk for cancer of the pelvic organs (ovaries, uterus, and vagina) and who do not have symptoms. Ask your health care provider if a screening pelvic exam is right for you.  If you have had past treatment for cervical cancer or a condition that could lead to cancer, you need Pap tests and screening for cancer for at least 20 years after your treatment. If Pap tests have been discontinued, your risk factors (such as having a new sexual partner) need to be reassessed to determine if screening  should be resumed. Some women have medical problems that increase the chance of getting cervical cancer. In these cases, your health care provider may recommend more frequent screening and Pap tests.    Colorectal cancer can be detected and often prevented. Most routine colorectal cancer screening begins at the age of 42 years and continues through age 17 years. However, your health care provider may recommend screening at an earlier age if you have risk factors for colon cancer. On a yearly basis, your health care provider may provide home test kits to check for hidden blood in the stool. Use of a small camera at the end of a tube, to directly examine the colon (sigmoidoscopy or colonoscopy), can detect the earliest forms of colorectal cancer. Talk to your health care provider about this at age 67, when routine screening begins. Direct exam of the colon should be repeated every 5-10 years through age 53 years, unless early forms of pre-cancerous polyps or small growths are found.  Osteoporosis is a disease in which the bones lose minerals and strength with aging. This can result in serious bone fractures or breaks. The risk of osteoporosis can be identified using a bone density scan. Women ages 60 years and over and women at risk for fractures or osteoporosis should discuss screening with their health care providers. Ask your health care provider whether you should take a calcium supplement or vitamin D to reduce the rate of osteoporosis.  Menopause can be associated with physical symptoms and risks. Hormone replacement therapy is available to decrease symptoms and risks. You should talk to your health care provider about whether hormone replacement therapy is right for you.  Use sunscreen. Apply sunscreen liberally and repeatedly throughout the day. You should seek shade when your shadow is shorter than you. Protect yourself by wearing long sleeves, pants, a wide-brimmed hat, and sunglasses year round,  whenever you are outdoors.  Once a month, do a whole body skin exam, using a mirror to look at the skin on your back. Tell your health care provider of new moles, moles that have irregular borders, moles that are larger than a pencil eraser, or moles that have changed in shape or color.  Stay current with required vaccines (immunizations).  Influenza vaccine. All adults should be immunized every year.  Tetanus, diphtheria, and acellular pertussis (Td, Tdap) vaccine. Pregnant women should receive 1 dose of Tdap vaccine during each pregnancy. The dose should  be obtained regardless of the length of time since the last dose. Immunization is preferred during the 27th-36th week of gestation. An adult who has not previously received Tdap or who does not know her vaccine status should receive 1 dose of Tdap. This initial dose should be followed by tetanus and diphtheria toxoids (Td) booster doses every 10 years. Adults with an unknown or incomplete history of completing a 3-dose immunization series with Td-containing vaccines should begin or complete a primary immunization series including a Tdap dose. Adults should receive a Td booster every 10 years.    Zoster vaccine. One dose is recommended for adults aged 57 years or older unless certain conditions are present.    Pneumococcal 13-valent conjugate (PCV13) vaccine. When indicated, a person who is uncertain of her immunization history and has no record of immunization should receive the PCV13 vaccine. An adult aged 40 years or older who has certain medical conditions and has not been previously immunized should receive 1 dose of PCV13 vaccine. This PCV13 should be followed with a dose of pneumococcal polysaccharide (PPSV23) vaccine. The PPSV23 vaccine dose should be obtained at least 8 weeks after the dose of PCV13 vaccine. An adult aged 52 years or older who has certain medical conditions and previously received 1 or more doses of PPSV23 vaccine should  receive 1 dose of PCV13. The PCV13 vaccine dose should be obtained 1 or more years after the last PPSV23 vaccine dose.    Pneumococcal polysaccharide (PPSV23) vaccine. When PCV13 is also indicated, PCV13 should be obtained first. All adults aged 46 years and older should be immunized. An adult younger than age 32 years who has certain medical conditions should be immunized. Any person who resides in a nursing home or long-term care facility should be immunized. An adult smoker should be immunized. People with an immunocompromised condition and certain other conditions should receive both PCV13 and PPSV23 vaccines. People with human immunodeficiency virus (HIV) infection should be immunized as soon as possible after diagnosis. Immunization during chemotherapy or radiation therapy should be avoided. Routine use of PPSV23 vaccine is not recommended for American Indians, [place]atives, or people younger than 65 years unless there are medical conditions that require PPSV23 vaccine. When indicated, people who have unknown immunization and have no record of immunization should receive PPSV23 vaccine. One-time revaccination 5 years after the first dose of PPSV23 is recommended for people aged 19-64 years who have chronic kidney failure, nephrotic syndrome, asplenia, or immunocompromised conditions. People who received 1-2 doses of PPSV23 before age 74 years should receive another dose of PPSV23 vaccine at age 89 years or later if at least 5 years have passed since the previous dose. Doses of PPSV23 are not needed for people immunized with PPSV23 at or after age 73 years.   Preventive Services / Frequency  Ages 21 years and over  Blood pressure check.  Lipid and cholesterol check.  Lung cancer screening. / Every year if you are aged 91-80 years and have a 30-pack-year history of smoking and currently smoke or have quit within the past 15 years. Yearly screening is stopped once you have quit smoking for at  least 15 years or develop a health problem that would prevent you from having lung cancer treatment.  Clinical breast exam.** / Every year after age 57 years.  BRCA-related cancer risk assessment.** / For women who have family members with a BRCA-related cancer (breast, ovarian, tubal, or peritoneal cancers).  Mammogram.** / Every year beginning at age  40 years and continuing for as long as you are in good health. Consult with your health care provider.  Pap test.** / Every 3 years starting at age 37 years through age 26 or 90 years with 3 consecutive normal Pap tests. Testing can be stopped between 65 and 70 years with 3 consecutive normal Pap tests and no abnormal Pap or HPV tests in the past 10 years.  Fecal occult blood test (FOBT) of stool. / Every year beginning at age 37 years and continuing until age 54 years. You may not need to do this test if you get a colonoscopy every 10 years.  Flexible sigmoidoscopy or colonoscopy.** / Every 5 years for a flexible sigmoidoscopy or every 10 years for a colonoscopy beginning at age 19 years and continuing until age 83 years.  Hepatitis C blood test.** / For all people born from [birth year] through 1963 and any individual with known risks for hepatitis C.  Osteoporosis screening.** / A one-time screening for women ages 30 years and over and women at risk for fractures or osteoporosis.  Skin self-exam. / Monthly.  Influenza vaccine. / Every year.  Tetanus, diphtheria, and acellular pertussis (Tdap/Td) vaccine.** / 1 dose of Td every 10 years.  Zoster vaccine.** / 1 dose for adults aged 42 years or older.  Pneumococcal 13-valent conjugate (PCV13) vaccine.** / Consult your health care provider.  Pneumococcal polysaccharide (PPSV23) vaccine.** / 1 dose for all adults aged 2 years and older. Screening for abdominal aortic aneurysm (AAA)  by ultrasound is recommended for people who have history of high blood pressure or who are current or former  smokers.

## 2015-04-05 NOTE — Progress Notes (Signed)
MEDICARE ANNUAL WELLNESS VISIT AND FOLLOW UP  Assessment:    1. Essential hypertension  DASH diet, exercise and monitor at home. Call if greater than 130/80.  - CBC with Differential/Platelet - BASIC METABOLIC PANEL WITH GFR - Hepatic function panel - TSH  2. Prediabetes Discussed general issues about diabetes pathophysiology and management., Educational material distributed., Suggested low cholesterol diet., Encouraged aerobic exercise., Discussed foot care., Reminded to get yearly retinal exam. - Hemoglobin A1c - HM DIABETES FOOT EXAM  3. Hyperlipidemia check lipids, decrease fatty foods, increase activity.  - Lipid panel  4. EMPHYSEMA Get on symbicort daily  5. Asthma, unspecified asthma severity, uncomplicated Get on symbicort  6. SDAT Still able to perform some ADLS just very poor shortterm memory, will monitor Check urine  7. Vitamin D deficiency - Vit D  25 hydroxy (rtn osteoporosis monitoring)  8. Medication management - Magnesium  9. Osteopenia Declines DEXA  10. Atherosclerosis of aorta Control blood pressure, cholesterol, glucose, increase exercise.   11. Anemia, unspecified anemia type History of anemia, questionable black stool, check labs - Iron and TIBC - Ferritin - Vitamin B12  12. SOB (shortness of breath) Vitals normal, O2 98% RA, will treat with zpak for possible COPD exacerbation, dulera samples given, get EKG, check labs - EKG 12-Lead  13. Urinary frequency - Urinalysis, Routine w reflex microscopic (not at [facility]) - Urine culture  Future Appointments Date Time Provider [place]  04/05/2015 2:00 PM Vicie Mutters, PA-C [id number] None     Plan:   During the course of the visit the patient was educated and counseled about appropriate screening and preventive services including:    Pneumococcal vaccine   Influenza vaccine  Td vaccine  Screening electrocardiogram  Screening mammography  Bone densitometry  screening  Colorectal cancer screening  Diabetes screening  Glaucoma screening  Nutrition counseling   Advanced directives: given info/requested  Conditions/risks identified: BMI: Discussed weight loss, diet, and increase physical activity.  Increase physical activity: AHA recommends 150 minutes of physical activity a week.  Medications reviewed DEXA- due 08/2014 but declines Diabetes is at goal, ACE/ARB therapy: Yes. Urinary Incontinence is not an issue: discussed non pharmacology and pharmacology options.  Fall risk: moderate- high- discussed PT, home fall assessment, medications.    Subjective:   [name] is a 81 y.o. female who presents for Medicare Annual Wellness Visit and 3 month follow up on hypertension, prediabetes, hyperlipidemia, vitamin D def as well as SOB. Date of last medicare wellness visit was 03/08/2014  Her blood pressure has been controlled at home, today their BP is BP: 138/70 mmHg She does not workout. She denies chest pain, dizziness. Very supportive family. She has COPD and has SOB today with weakness. Her daughter is here with her, she is very poor historian, does not know if she had SOB yesterday but has had this AM with her daughter there. Her daughter gave her 2 puffs of Advair that she does not use regularly. She has been sleeping a lot recently. She also had anemia, due to age wanted conservative treatment, she was put on zantac and taken off Vitamin C.  Hemoglobic was at 8.6 a year ago to 10.4 to 11.6.  Lab Results  Component Value Date   WBC 5.7 06/14/2014   HGB 11.6* 06/14/2014   HCT 34.8* 06/14/2014   MCV 90.6 06/14/2014   PLT 264 06/14/2014   She is not on cholesterol medication and denies myalgias. Her cholesterol is at goal. The cholesterol last  visit was:   Lab Results  Component Value Date   CHOL 221* 06/14/2014   HDL 60 06/14/2014   LDLCALC 124* 06/14/2014   TRIG 186* 06/14/2014   CHOLHDL 3.7 06/14/2014   She has been  working on diet and exercise for prediabetes, and denies polydipsia, polyuria and visual disturbances. Last A1C in the office was:  Lab Results  Component Value Date   HGBA1C 5.8* 06/14/2014   Patient is on Vitamin D supplement. Lab Results  Component Value Date   VD25OH 58 12/05/2013    She states that she wants everything done for her and does not want to die.   Names of Other Physician/Practitioners you currently use: 1.  [facility]- here for primary care 2. Dr. Einar Gip, eye doctor, last visit yearly 3. Wears dentures- no dentist Patient Care Team: Unk Pinto, MD as PCP - General (Internal Medicine) Minus Breeding, MD as Consulting Physician (Cardiology)  Medication Review No current outpatient prescriptions on file prior to visit.   No current facility-administered medications on file prior to visit.    Current Problems (verified) Patient Active Problem List   Diagnosis Date Noted  . Osteopenia 04/05/2015  . Atherosclerosis of aorta 04/05/2015  . SDAT 01/30/2014  . Medication management 12/05/2013  . Hyperlipidemia   . Prediabetes   . Vitamin D deficiency   . Essential hypertension 11/29/2010  . EMPHYSEMA 11/29/2010  . Asthma 11/29/2010    Screening Tests Health Maintenance  Topic Date Due  . PNA vac Low Risk Adult (2 of 2 - PCV13) 10/10/2007  . COLONOSCOPY  10/09/2013  . INFLUENZA VACCINE  04/30/2015  . TETANUS/TDAP  09/07/2022  . DEXA SCAN  Completed  . ZOSTAVAX  Completed     Immunization History  Administered Date(s) Administered  . Influenza, High Dose Seasonal PF 06/14/2014  . Influenza-Unspecified 08/13/2013  . Pneumococcal-Unspecified 10/09/2006  . Td 09/08/2002, 09/07/2012  . Zoster 09/10/2012    Preventative care: Tetanus: 2013  Pneumovax: 2008  Prevnar 13: DUE but out of in the office Flu vaccine: 2014  Zostavax: 2013  Pap: 2001 negative declines MGM: 10/2013 normal (had [place] left breast  normal)  DEXA: 08/2012 osteopenia due 08/2014 Colonoscopy: 2005  Will not get another due to age EGD: N/A  Echo 2011 mild MR, EF 55% Cardiolite negative 2008 CT chest 2012 CAD and COPD CXR 02/2014  Allergies Allergies  Allergen Reactions  . Codeine Nausea And Vomiting  . Desyrel [Trazodone]   . Penicillins     "childhood"  . Sulfa Antibiotics Nausea And Vomiting  . Theophyllines   . Ventolin [Albuterol]     tremor   Surgical history Past Surgical History  Procedure Laterality Date  . Lumpectomy and radiation      for breast cancer  . Throat surgery    . Appendectomy    . Repair zenker's diverticula     Family history Family History  Problem Relation Age of Onset  . Heart attack Brother     had in his 67s   Tobacco History  Substance Use Topics  . Smoking status: Former Smoker -- 1.00 packs/day for 20 years    Types: Cigarettes    Quit date: 09/08/1998  . Smokeless tobacco: Never Used     Comment: quit 1988  . Alcohol Use: No   MEDICARE WELLNESS OBJECTIVES: Tobacco use: She does not smoke.  Patient is a former smoker. Alcohol Current alcohol use: none Caffeine Current caffeine use: Occ Osteoporosis: postmenopausal estrogen deficiency  and dietary calcium and/or vitamin D deficiency, History of fracture in the past year: no Diet: in general, a "healthy" diet   Physical activity: None Depression/mood screen:   Depression screen [facility] 2/9 04/05/2015  Decreased Interest 0  Down, Depressed, Hopeless 0  PHQ - 2 Score 0   Hearing: impaired Visual acuity: normal,  does perform annual eye exam  ADLs:  In your present state of health, do you have any difficulty performing the following activities: 04/05/2015  Hearing? Y  Vision? N  Difficulty concentrating or making decisions? Y  Walking or climbing stairs? Y  Dressing or bathing? N  Doing errands, shopping? Y  Preparing Food and eating ? Y  Using the Toilet? N  In the past six months, have you accidently leaked  urine? N  Do you have problems with loss of bowel control? N  Managing your Medications? N  Managing your Finances? N  Housekeeping or managing your Housekeeping? Y    Fall risk: Moderate Risk Cognitive Testing  Alert? Yes  Normal Appearance?Yes  Oriented to person? Yes  Place? No   Time? No  Recall of three objects?  0/3  Can perform simple calculations? No  Displays appropriate judgment?No  Can read the correct time from a watch face? No EOL planning: Does patient have an advance directive?: No Would patient like information on creating an advanced directive?: Yes - Educational materials given   Review of Systems  Constitutional: Positive for weight loss and malaise/fatigue. Negative for fever, chills and diaphoresis.  HENT: Positive for hearing loss. Negative for congestion, ear discharge, ear pain, nosebleeds, sore throat and tinnitus.   Eyes: Negative.   Respiratory: Positive for shortness of breath. Negative for cough, hemoptysis, sputum production, wheezing and stridor.   Cardiovascular: Positive for chest pain and leg swelling. Negative for palpitations, orthopnea, claudication and PND.  Gastrointestinal: Positive for nausea, vomiting and melena. Negative for heartburn, abdominal pain, diarrhea, constipation and blood in stool.  Genitourinary: Positive for frequency. Negative for dysuria, urgency, hematuria and flank pain.  Musculoskeletal: Positive for falls. Negative for myalgias, back pain, joint pain and neck pain.  Skin: Negative.  Negative for itching.  Neurological: Positive for weakness. Negative for dizziness, tingling, tremors, sensory change, speech change, focal weakness, seizures, loss of consciousness and headaches.  Psychiatric/Behavioral: Positive for memory loss. Negative for depression, suicidal ideas and substance abuse. The patient is not nervous/anxious and does not have insomnia.     Objective:   Blood pressure 138/70, pulse 76, temperature 98.1 F  (36.7 C), resp. rate 16, height 5\' 3"  (1.6 m), weight 116 lb (52.617 kg). Body mass index is 20.55 kg/(m^2). Wt Readings from Last 3 Encounters:  04/05/15 116 lb (52.617 kg)  07/24/14 116 lb (52.617 kg)  06/14/14 120 lb (54.432 kg)   General appearance: alert, no distress, WD/WN,  female HEENT: normocephalic, sclerae anicteric, TMs pearly, nares patent, no discharge or erythema, pharynx normal Oral cavity: MMM, no lesions Neck: supple, no lymphadenopathy, no thyromegaly, no masses Heart: RRR, holosystolic murmur with mid clickbest at LSB with prominate S2 Lungs: Decreased breath sounds without rales, rhonchi.  Abdomen: +bs, soft, mild diffuse lower AB tenderness, non distended, no masses, no hepatomegaly, no splenomegaly Musculoskeletal: nontender, no swelling, no obvious deformity Extremities: 1-2+  edema, no cyanosis, no clubbing Pulses: 2+ symmetric, upper and lower extremities, normal cap refill Neurological: alert, oriented x 3, CN2-12 intact, strength decreased upper extremities and lower extremities, sensation normal throughout, DTRs 2+ throughout, no cerebellar signs, gait  unsteady Skin: Ecchymosis left lateral leg from knee to calf, no weeping.  Psychiatric: normal affect, behavior normal, pleasant  Breast: defer Gyn: defer Rectal: defer  Medicare Attestation I have personally reviewed: The patient's medical and social history Their use of alcohol, tobacco or illicit drugs Their current medications and supplements The patient's functional ability including ADLs,fall risks, home safety risks, cognitive, and hearing and visual impairment Diet and physical activities Evidence for depression or mood disorders  The patient's weight, height, BMI, and visual acuity have been recorded in the chart.  I have made referrals, counseling, and provided education to the patient based on review of the above and I have provided the patient with a written personalized care plan for  preventive services.     Vicie Mutters, PA-C   04/05/2015

## 2015-04-06 LAB — URINALYSIS, ROUTINE W REFLEX MICROSCOPIC
GLUCOSE, UA: NEGATIVE mg/dL
Nitrite: NEGATIVE
Protein, ur: NEGATIVE mg/dL
Specific Gravity, Urine: 1.02 (ref 1.005–1.030)
Urobilinogen, UA: 0.2 mg/dL (ref 0.0–1.0)
pH: 5.5 (ref 5.0–8.0)

## 2015-04-06 LAB — URINALYSIS, MICROSCOPIC ONLY
BACTERIA UA: NONE SEEN
CASTS: NONE SEEN
Crystals: NONE SEEN
SQUAMOUS EPITHELIAL / LPF: NONE SEEN

## 2015-04-06 LAB — VITAMIN D 25 HYDROXY (VIT D DEFICIENCY, FRACTURES): VIT D 25 HYDROXY: 34 ng/mL (ref 30–100)

## 2015-04-07 LAB — URINE CULTURE: Colony Count: 10000

## 2015-04-09 ENCOUNTER — Other Ambulatory Visit: Payer: Self-pay | Admitting: Internal Medicine

## 2015-04-09 DIAGNOSIS — Z1231 Encounter for screening mammogram for malignant neoplasm of breast: Secondary | ICD-10-CM

## 2015-05-10 ENCOUNTER — Ambulatory Visit (INDEPENDENT_AMBULATORY_CARE_PROVIDER_SITE_OTHER): Payer: Medicare Other | Admitting: *Deleted

## 2015-05-10 DIAGNOSIS — N390 Urinary tract infection, site not specified: Secondary | ICD-10-CM

## 2015-05-11 LAB — URINE CULTURE: Colony Count: 50000

## 2015-05-14 ENCOUNTER — Ambulatory Visit
Admission: RE | Admit: 2015-05-14 | Discharge: 2015-05-14 | Disposition: A | Discharge status: Home / Self Care | Payer: Medicare Other | Source: Ambulatory Visit | Attending: Internal Medicine | Admitting: Internal Medicine

## 2015-05-14 DIAGNOSIS — Z1231 Encounter for screening mammogram for malignant neoplasm of breast: Secondary | ICD-10-CM | POA: Diagnosis not present

## 2015-09-20 ENCOUNTER — Encounter: Payer: Self-pay | Admitting: Physician Assistant

## 2015-10-03 DIAGNOSIS — Z23 Encounter for immunization: Secondary | ICD-10-CM | POA: Diagnosis not present

## 2015-10-15 ENCOUNTER — Encounter: Payer: Self-pay | Admitting: Internal Medicine

## 2015-10-15 ENCOUNTER — Ambulatory Visit (INDEPENDENT_AMBULATORY_CARE_PROVIDER_SITE_OTHER): Payer: Medicare Other | Admitting: Internal Medicine

## 2015-10-15 VITALS — BP 146/64 | HR 80 | Temp 98.0°F | Resp 16 | Ht 63.0 in | Wt 111.0 lb

## 2015-10-15 DIAGNOSIS — R7303 Prediabetes: Secondary | ICD-10-CM

## 2015-10-15 DIAGNOSIS — E782 Mixed hyperlipidemia: Secondary | ICD-10-CM | POA: Diagnosis not present

## 2015-10-15 DIAGNOSIS — E559 Vitamin D deficiency, unspecified: Secondary | ICD-10-CM

## 2015-10-15 DIAGNOSIS — R350 Frequency of micturition: Secondary | ICD-10-CM | POA: Diagnosis not present

## 2015-10-15 DIAGNOSIS — Z79899 Other long term (current) drug therapy: Secondary | ICD-10-CM

## 2015-10-15 DIAGNOSIS — I10 Essential (primary) hypertension: Secondary | ICD-10-CM

## 2015-10-15 DIAGNOSIS — R7309 Other abnormal glucose: Secondary | ICD-10-CM | POA: Diagnosis not present

## 2015-10-15 LAB — CBC WITH DIFFERENTIAL/PLATELET
BASOS PCT: 1 % (ref 0–1)
Basophils Absolute: 0.1 10*3/uL (ref 0.0–0.1)
Eosinophils Absolute: 0.1 10*3/uL (ref 0.0–0.7)
Eosinophils Relative: 2 % (ref 0–5)
HEMATOCRIT: 38 % (ref 36.0–46.0)
HEMOGLOBIN: 13.1 g/dL (ref 12.0–15.0)
LYMPHS PCT: 38 % (ref 12–46)
Lymphs Abs: 2.3 10*3/uL (ref 0.7–4.0)
MCH: 33.8 pg (ref 26.0–34.0)
MCHC: 34.5 g/dL (ref 30.0–36.0)
MCV: 97.9 fL (ref 78.0–100.0)
MPV: 9.9 fL (ref 8.6–12.4)
Monocytes Absolute: 0.5 10*3/uL (ref 0.1–1.0)
Monocytes Relative: 8 % (ref 3–12)
NEUTROS ABS: 3.1 10*3/uL (ref 1.7–7.7)
NEUTROS PCT: 51 % (ref 43–77)
Platelets: 302 10*3/uL (ref 150–400)
RBC: 3.88 MIL/uL (ref 3.87–5.11)
RDW: 13.4 % (ref 11.5–15.5)
WBC: 6.1 10*3/uL (ref 4.0–10.5)

## 2015-10-15 LAB — LIPID PANEL
CHOL/HDL RATIO: 2.4 ratio (ref <=5.0)
Cholesterol: 219 mg/dL — ABNORMAL HIGH (ref 125–200)
HDL: 91 mg/dL (ref >=46)
LDL CALC: 99 mg/dL (ref <130)
Triglycerides: 143 mg/dL (ref <150)
VLDL: 29 mg/dL (ref <30)

## 2015-10-15 LAB — HEPATIC FUNCTION PANEL
ALBUMIN: 4.2 g/dL (ref 3.6–5.1)
ALT: 9 U/L (ref 6–29)
AST: 15 U/L (ref 10–35)
Alkaline Phosphatase: 65 U/L (ref 33–130)
BILIRUBIN INDIRECT: 0.6 mg/dL (ref 0.2–1.2)
Bilirubin, Direct: 0.1 mg/dL (ref <=0.2)
TOTAL PROTEIN: 6.7 g/dL (ref 6.1–8.1)
Total Bilirubin: 0.7 mg/dL (ref 0.2–1.2)

## 2015-10-15 LAB — BASIC METABOLIC PANEL WITH GFR
BUN: 13 mg/dL (ref 7–25)
CALCIUM: 9.5 mg/dL (ref 8.6–10.4)
CO2: 30 mmol/L (ref 20–31)
CREATININE: 0.8 mg/dL (ref 0.60–0.88)
Chloride: 96 mmol/L — ABNORMAL LOW (ref 98–110)
GFR, EST AFRICAN AMERICAN: 76 mL/min (ref >=60)
GFR, EST NON AFRICAN AMERICAN: 66 mL/min (ref >=60)
GLUCOSE: 99 mg/dL (ref 65–99)
Potassium: 4.1 mmol/L (ref 3.5–5.3)
Sodium: 134 mmol/L — ABNORMAL LOW (ref 135–146)

## 2015-10-15 LAB — HEMOGLOBIN A1C
HEMOGLOBIN A1C: 5.8 % — AB (ref <5.7)
MEAN PLASMA GLUCOSE: 120 mg/dL — AB (ref <117)

## 2015-10-15 LAB — TSH: TSH: 0.753 u[IU]/mL (ref 0.350–4.500)

## 2015-10-15 NOTE — Progress Notes (Signed)
Patient ID: [name], female   DOB: 26-Apr-1925, 82 y.o.   MRN: [id number]  Assessment and Plan:  Hypertension:  -Continue medication,  -monitor blood pressure at home.  -Continue DASH diet.   -Reminder to go to the ER if any CP, SOB, nausea, dizziness, severe HA, changes vision/speech, left arm numbness and tingling, and jaw pain.  Cholesterol: -Continue diet and exercise.  -Check cholesterol.   Pre-diabetes: -Continue diet and exercise.  -Check A1C  Vitamin D Def: -check level -continue medications.   SDAT  -advanced per daughters report -will check for UTI given daughters complaint of urinary frequency -concern that son is not meeting adequate needs to take care of her -recommended that she move in with her daughter -offered consult with home health nurse which was declined  Continue diet and meds as discussed. Further disposition pending results of labs.  HPI 82 y.o. female  presents for 3 month follow up with hypertension, hyperlipidemia, prediabetes and vitamin D.   Her blood pressure has been controlled at home, today their BP is BP: (!) 146/64 mmHg.   She does not workout. She denies chest pain, shortness of breath, dizziness.   She is not on cholesterol medication and denies myalgias. Her cholesterol is not at goal. The cholesterol last visit was:   Lab Results  Component Value Date   CHOL 224* 04/05/2015   HDL 71 04/05/2015   LDLCALC 121* 04/05/2015   TRIG 162* 04/05/2015   CHOLHDL 3.2 04/05/2015     She has not been working on diet and exercise for prediabetes, and denies foot ulcerations, hyperglycemia, hypoglycemia , increased appetite, nausea, paresthesia of the feet, polydipsia, polyuria, visual disturbances, vomiting and weight loss. Last A1C in the office was:  Lab Results  Component Value Date   HGBA1C 5.6 04/05/2015    Patient is on Vitamin D supplement.  Lab Results  Component Value Date   VD25OH 34 04/05/2015      Current  Medications:  No current outpatient prescriptions on file prior to visit.   No current facility-administered medications on file prior to visit.    Medical History:  Past Medical History  Diagnosis Date  . Pneumonia     multiple  . Need for pneumococcal vaccine     had vaccine several times  . Gastroesophageal reflux disease with hiatal hernia   . Asthma   . High cholesterol   . Breast cancer ([place])   . Hypertension   . Prediabetes   . Anxiety   . Depression   . COPD (chronic obstructive pulmonary disease) ([place])   . Vitamin D deficiency   . Osteoporosis     Allergies:  Allergies  Allergen Reactions  . Codeine Nausea And Vomiting  . Desyrel [Trazodone]   . Penicillins     "childhood"  . Sulfa Antibiotics Nausea And Vomiting  . Theophyllines   . Ventolin [Albuterol]     tremor     Review of Systems:  Review of Systems  Constitutional: Negative for fever and chills.  HENT: Positive for congestion. Negative for ear pain and sore throat.   Respiratory: Negative for cough, shortness of breath and wheezing.   Cardiovascular: Negative for chest pain, palpitations and leg swelling.  Gastrointestinal: Negative for diarrhea, constipation, blood in stool and melena.  Genitourinary: Negative.   Neurological: Negative for headaches.  Psychiatric/Behavioral: Positive for memory loss. Negative for depression. The patient is not nervous/anxious and does not have insomnia.     Family history- Review and  unchanged  Social history- Review and unchanged  Physical Exam: BP 146/64 mmHg  Pulse 80  Temp(Src) 98 F (36.7 C) (Temporal)  Resp 16  Ht 5\' 3"  (1.6 m)  Wt 111 lb (50.349 kg)  BMI 19.67 kg/m2  SpO2 92% Wt Readings from Last 3 Encounters:  10/15/15 111 lb (50.349 kg)  04/05/15 116 lb (52.617 kg)  07/24/14 116 lb (52.617 kg)    General Appearance: Well nourished well developed, in no apparent distress. Eyes: PERRLA, EOMs, conjunctiva no swelling or  erythema ENT/Mouth: Ear canals normal without obstruction, swelling, erythma, discharge.  TMs normal bilaterally.  Oropharynx dry, dentures in place, clear, without exudate, or postoropharyngeal swelling. Neck: Supple, thyroid normal,no cervical adenopathy  Respiratory: Respiratory effort normal, Breath sounds clear A&P without rhonchi, wheeze, or rale.  No retractions, no accessory usage. Cardio: 3/6 murmur heard best on left sternal border with no MRGs. Brisk peripheral pulses without edema.  Abdomen: Soft, + BS,  Non tender, no guarding, rebound, hernias, masses. Musculoskeletal: Full ROM, 5/5 strength, Normal gait Skin: Warm, dry without rashes, lesions, ecchymosis.  Neuro: Awake and oriented X 3, Cranial nerves intact. Normal muscle tone, no cerebellar symptoms. Psych: Poor judgement, oriented to self, does not know her daughter, answers questions inappropriately if at all    Starlyn Skeans, PA-C 10:39 AM [facility]

## 2015-10-16 LAB — URINALYSIS, ROUTINE W REFLEX MICROSCOPIC
BILIRUBIN URINE: NEGATIVE
Glucose, UA: NEGATIVE
HGB URINE DIPSTICK: NEGATIVE
Leukocytes, UA: NEGATIVE
Nitrite: NEGATIVE
Protein, ur: NEGATIVE
SPECIFIC GRAVITY, URINE: 1.021 (ref 1.001–1.035)
pH: 6 (ref 5.0–8.0)

## 2015-10-16 LAB — URINE CULTURE
COLONY COUNT: NO GROWTH
Organism ID, Bacteria: NO GROWTH

## 2016-01-02 IMAGING — CR DG CHEST 2V
2 series · 2 of 2 positions shown · non-contrast
Comparison: Chest CTA [DATE] and earlier.

CLINICAL DATA: 89-year-old female with abnormal right lower lobe
auscultation wheezing and cough. Initial encounter.

EXAM:
CHEST  2 VIEW

[view not recorded (1 of 2)]
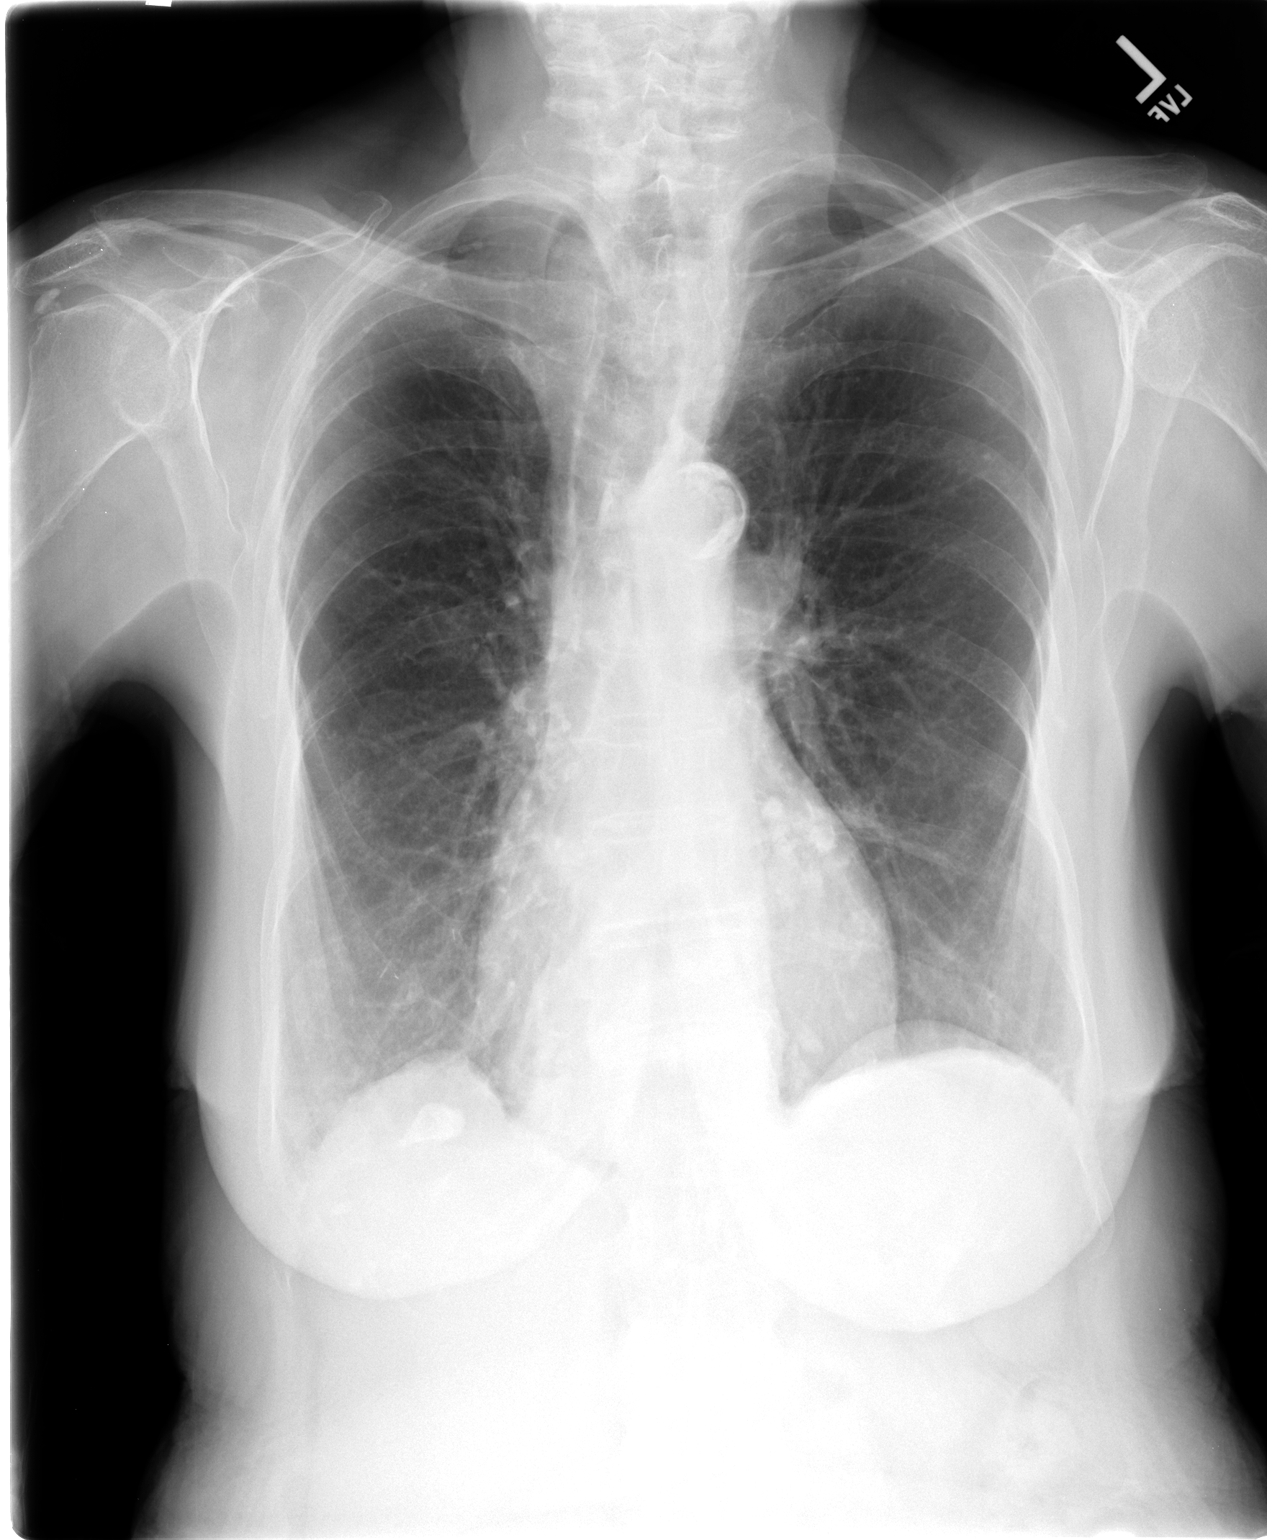

[view not recorded (2 of 2)]
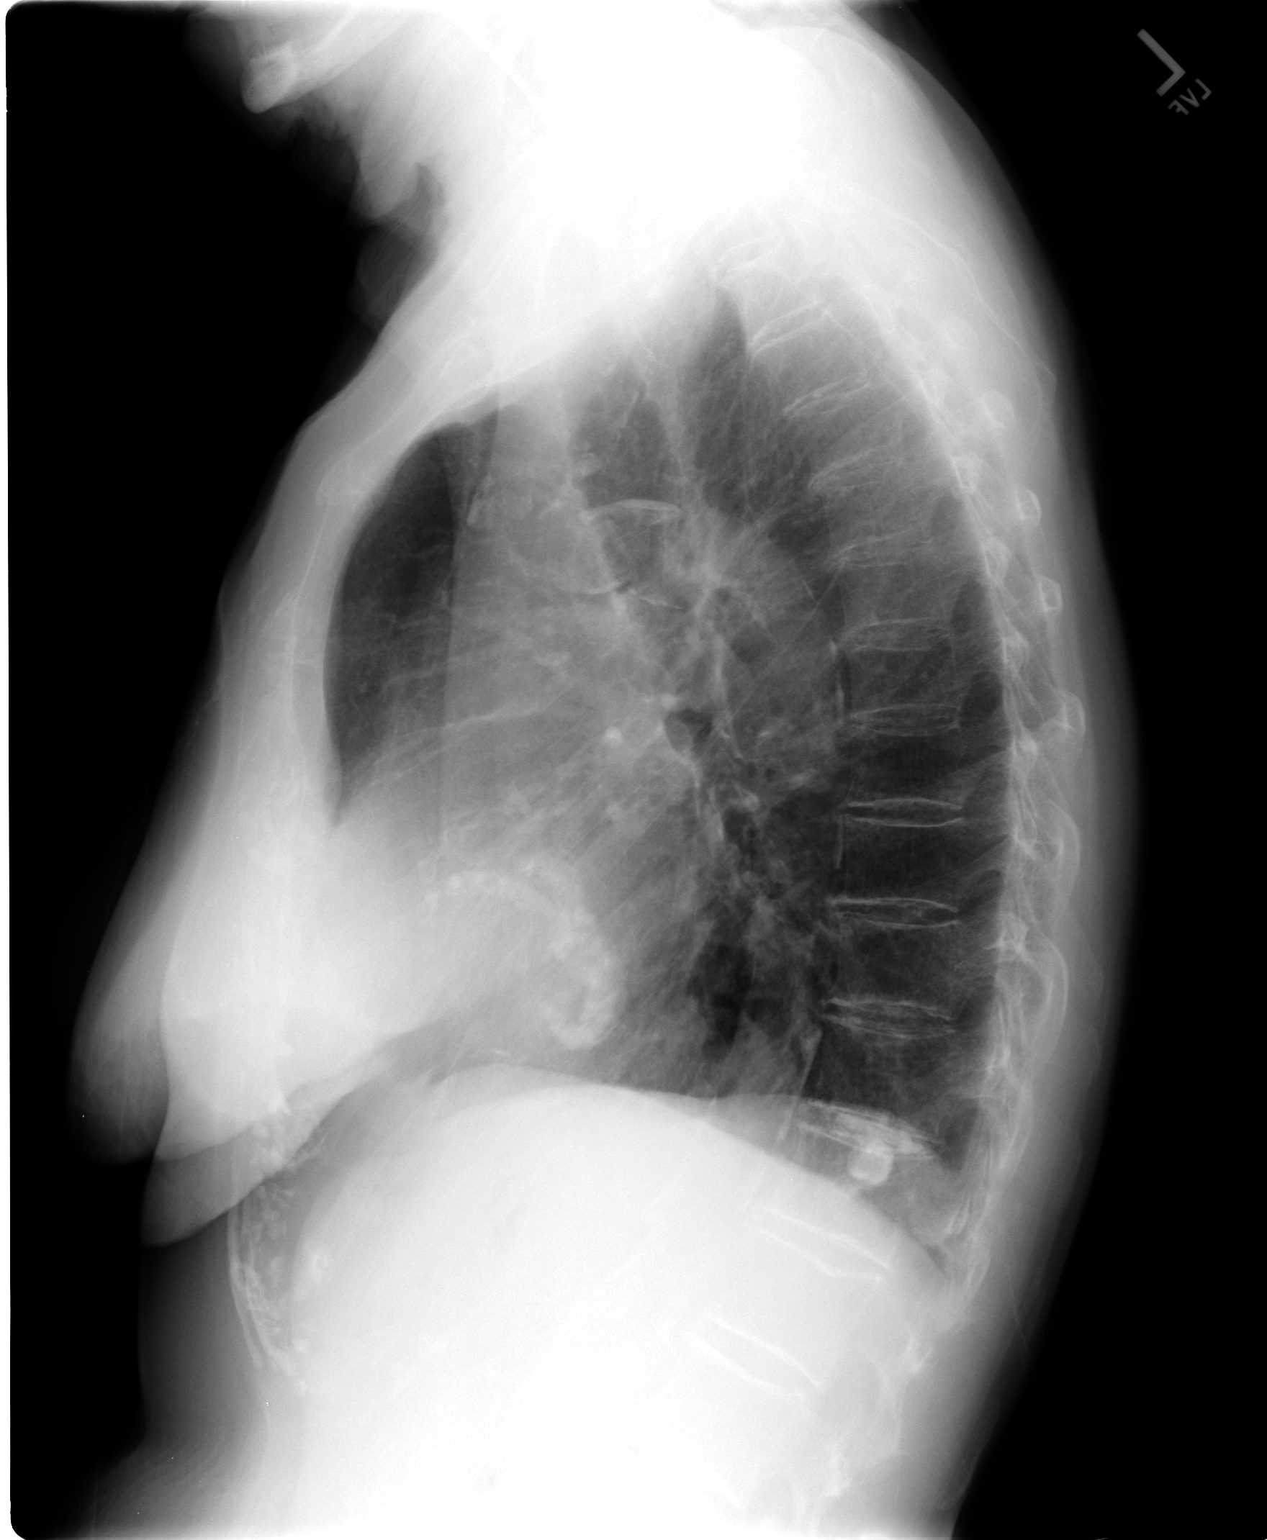

[2 of 2 positions shown; findings below may reference images not displayed]

FINDINGS: Large calcified granuloma re- identified at the right posterior
costophrenic angle. Lung volumes are large and have mildly increased
since 7181. Stable biapical scarring and evidence of upper lobe
emphysema. No pneumothorax or pulmonary edema. No pleural effusion
or consolidation. Chronic retrocardiac hiatal hernia is stable since
7181. Stable cardiac size and mediastinal contours. Extensive
calcified atherosclerosis of the aorta. No acute osseous abnormality
identified.
IMPRESSION: No acute cardiopulmonary abnormality.

Chronic hyperinflation has mildly progressed since 7181. Chronic
hiatal hernia is stable along with a calcified granuloma at the
right lung base.

## 2016-01-18 ENCOUNTER — Ambulatory Visit: Payer: Self-pay | Admitting: Internal Medicine

## 2016-02-11 ENCOUNTER — Ambulatory Visit: Payer: Self-pay | Admitting: Internal Medicine

## 2016-03-12 ENCOUNTER — Ambulatory Visit (INDEPENDENT_AMBULATORY_CARE_PROVIDER_SITE_OTHER): Payer: Medicare Other | Admitting: Internal Medicine

## 2016-03-12 ENCOUNTER — Encounter: Payer: Self-pay | Admitting: Internal Medicine

## 2016-03-12 VITALS — BP 152/64 | HR 68 | Temp 97.8°F | Resp 14 | Ht 63.0 in | Wt 106.0 lb

## 2016-03-12 DIAGNOSIS — R300 Dysuria: Secondary | ICD-10-CM

## 2016-03-12 MED ORDER — CIPROFLOXACIN HCL 500 MG PO TABS: 500.0000 mg | ORAL_TABLET | Freq: Two times a day (BID) | ORAL | Status: AC

## 2016-03-12 NOTE — Progress Notes (Signed)
   Subjective:    Patient ID: [name], female    DOB: July 08, 1925, 82 y.o.   MRN: [id number]  HPI  Patient presents to the office with her son for concerns for a UTI.  Her son notes that she is having some pain in the lower abdomen and complains that she is brought in today.  She notes that the pain is in her vagina area.  She notes no rash.  NO redness.  She denies frequency, urgency, or dysuria.  She doesn't think she goes enough.  She denies abdominal pain.      Review of Systems  Constitutional: Negative for fever, chills and fatigue.  Gastrointestinal: Negative for nausea, vomiting, abdominal pain, diarrhea and constipation.  Genitourinary: Positive for dysuria. Negative for urgency, hematuria, flank pain and difficulty urinating.       Objective:   Physical Exam  Constitutional: She is oriented to person, place, and time. She appears well-developed and well-nourished.  HENT:  Head: Normocephalic and atraumatic.  Mouth/Throat: Oropharynx is clear and moist. No oropharyngeal exudate.  Eyes: Conjunctivae are normal. No scleral icterus.  Neck: Normal range of motion. Neck supple. No JVD present. No thyromegaly present.  Pulmonary/Chest: Effort normal and breath sounds normal. No respiratory distress. She has no wheezes. She has no rales. She exhibits no tenderness.  Abdominal: Soft. Bowel sounds are normal. She exhibits no distension and no mass. There is no tenderness. There is no rebound and no guarding.  Genitourinary:  Atrophic tissue in the vaginal canal without rash, redness, or breakdown.  Musculoskeletal: Normal range of motion.  Lymphadenopathy:    She has no cervical adenopathy.  Neurological: She is alert and oriented to person, place, and time.  Skin: Skin is warm and dry.  Psychiatric: She has a normal mood and affect. Her behavior is normal. Judgment and thought content normal.  Nursing note and vitals reviewed.   Filed Vitals:   03/12/16 0939  BP:  152/64  Pulse: 68  Temp: 97.8 F (36.6 C)  Resp: 14         Assessment & Plan:    1. Dysuria -Patient unable to give clear history but exam WNL.  Will check urine for infection given poor hydration and history of frequent UTIs.  Hold abx until UA returns tomorrow morning. -have encouraged plenty of hydration.  - Urinalysis, Routine w reflex microscopic (not at [facility]) - Culture, Urine - ciprofloxacin (CIPRO) 500 MG tablet; Take 1 tablet (500 mg total) by mouth 2 (two) times daily.  Dispense: 10 tablet; Refill: 0

## 2016-03-13 LAB — URINALYSIS, ROUTINE W REFLEX MICROSCOPIC
Bilirubin Urine: NEGATIVE
GLUCOSE, UA: NEGATIVE
KETONES UR: NEGATIVE
Nitrite: NEGATIVE
PH: 7.5 (ref 5.0–8.0)
Protein, ur: NEGATIVE
Specific Gravity, Urine: 1.013 (ref 1.001–1.035)

## 2016-03-13 LAB — URINE CULTURE: Colony Count: 10000

## 2016-03-13 LAB — URINALYSIS, MICROSCOPIC ONLY
Bacteria, UA: NONE SEEN [HPF]
CASTS: NONE SEEN [LPF]
CRYSTALS: NONE SEEN [HPF]
SQUAMOUS EPITHELIAL / LPF: NONE SEEN [HPF] (ref <=5)
WBC, UA: NONE SEEN WBC/HPF (ref <=5)
Yeast: NONE SEEN [HPF]

## 2016-04-24 ENCOUNTER — Emergency Department (HOSPITAL_COMMUNITY)
Admission: EM | Admit: 2016-04-24 | Discharge: 2016-04-25 | Disposition: A | Discharge status: Home / Self Care | Payer: Medicare Other | Attending: Emergency Medicine | Admitting: Emergency Medicine

## 2016-04-24 ENCOUNTER — Encounter (HOSPITAL_COMMUNITY): Payer: Self-pay | Admitting: Emergency Medicine

## 2016-04-24 DIAGNOSIS — Y999 Unspecified external cause status: Secondary | ICD-10-CM | POA: Insufficient documentation

## 2016-04-24 DIAGNOSIS — I10 Essential (primary) hypertension: Secondary | ICD-10-CM | POA: Diagnosis not present

## 2016-04-24 DIAGNOSIS — J449 Chronic obstructive pulmonary disease, unspecified: Secondary | ICD-10-CM | POA: Diagnosis not present

## 2016-04-24 DIAGNOSIS — Y9289 Other specified places as the place of occurrence of the external cause: Secondary | ICD-10-CM | POA: Insufficient documentation

## 2016-04-24 DIAGNOSIS — Y9389 Activity, other specified: Secondary | ICD-10-CM | POA: Insufficient documentation

## 2016-04-24 DIAGNOSIS — S81812A Laceration without foreign body, left lower leg, initial encounter: Secondary | ICD-10-CM

## 2016-04-24 DIAGNOSIS — Z87891 Personal history of nicotine dependence: Secondary | ICD-10-CM | POA: Diagnosis not present

## 2016-04-24 DIAGNOSIS — Z853 Personal history of malignant neoplasm of breast: Secondary | ICD-10-CM | POA: Diagnosis not present

## 2016-04-24 NOTE — ED Triage Notes (Signed)
Pt. presents with skin tear at left shin approx. 2" sustained this evening , accidentally hit it against car's running board with minimal bleeding , dressing applied at triage .

## 2016-04-25 MED ORDER — ACETAMINOPHEN 325 MG PO TABS
650.0000 mg | ORAL_TABLET | Freq: Once | ORAL | Status: AC
  Administered 2016-04-25: 650 mg via ORAL
  Filled 2016-04-25: qty 2

## 2016-04-25 MED ORDER — BACITRACIN ZINC 500 UNIT/GM EX OINT
TOPICAL_OINTMENT | Freq: Once | CUTANEOUS | Status: AC
  Administered 2016-04-25: 1 via TOPICAL
  Filled 2016-04-25: qty 1.8

## 2016-04-25 NOTE — Discharge Instructions (Signed)
Change dressing once a day. Apply antibiotic ointment once a day. Watch for signs of infection (redness, swelling, drainage) - if they appear, she might need to be on oral antibiotics. She may take acetaminophen (Tylenol) 650 mg every four hours as needed for pain.

## 2016-04-25 NOTE — ED Provider Notes (Signed)
[facility] DEPT Provider Note   CSN: [id number] Arrival date & time: 04/24/16  2107  First Provider Contact:  None    By signing my name below, I, Julien Nordmann, attest that this documentation has been prepared under the direction and in the presence of Delora Fuel, MD.  Electronically Signed: Julien Nordmann, ED Scribe. 04/25/16. 12:32 AM.   History   Chief Complaint Chief Complaint  Patient presents with  . Skin Problem    Skin Tear    The history is provided by a relative. No language interpreter was used.   HPI Comments: [name] is a 82 y.o. female who has a PMhx of anxiety, asthma, breast cancer, COPD, GERD, high cholesterol, and HTN presents to the Emergency Department complaining of sudden onset, unchanged, moderate skin tear to her left leg onset this evening. Per relative, pt was getting into the care when she scrapped her left shin leaving a moderate skin tear. There is minimal bleeding and the area was covered in dressing when she arrived.   Past Medical History:  Diagnosis Date  . Anxiety   . Asthma   . Breast cancer ([place])   . COPD (chronic obstructive pulmonary disease) ([place])   . Depression   . Gastroesophageal reflux disease with hiatal hernia   . High cholesterol   . Hypertension   . Need for pneumococcal vaccine    had vaccine several times  . Osteoporosis   . Pneumonia    multiple  . Prediabetes   . Vitamin D deficiency     Patient Active Problem List   Diagnosis Date Noted  . Osteopenia 04/05/2015  . Atherosclerosis of aorta ([place]) 04/05/2015  . Anemia 04/05/2015  . SDAT 01/30/2014  . Medication management 12/05/2013  . Hyperlipidemia   . Prediabetes   . Vitamin D deficiency   . Essential hypertension 11/29/2010  . EMPHYSEMA 11/29/2010  . Asthma 11/29/2010    Past Surgical History:  Procedure Laterality Date  . APPENDECTOMY    . lumpectomy and radiation     for breast cancer  . REPAIR ZENKER'S DIVERTICULA    . THROAT  SURGERY      OB History    No data available       Home Medications    Prior to Admission medications   Not on File    Family History Family History  Problem Relation Age of Onset  . Heart attack Brother     had in his 62s    Social History Social History  Substance Use Topics  . Smoking status: Former Smoker    Packs/day: 1.00    Years: 20.00    Types: Cigarettes    Quit date: 09/08/1998  . Smokeless tobacco: Never Used     Comment: quit 1988  . Alcohol use No     Allergies   Codeine; Desyrel [trazodone]; Penicillins; Sulfa antibiotics; Theophyllines; and Ventolin [albuterol]   Review of Systems Review of Systems  Skin: Positive for wound (skin tear).  All other systems reviewed and are negative.    Physical Exam Updated Vital Signs BP 167/86   Pulse 75   Temp 97.4 F (36.3 C) (Oral)   SpO2 96%   Physical Exam  Constitutional: She appears well-developed and well-nourished.  HENT:  Head: Normocephalic and atraumatic.  Eyes: EOM are normal. Pupils are equal, round, and reactive to light.  Neck: Normal range of motion. Neck supple. No JVD present.  Cardiovascular: Normal rate, regular rhythm and normal heart sounds.  No murmur heard. Pulmonary/Chest: Effort normal. She has no wheezes. She has no rales. She exhibits no tenderness.  Abdominal: Soft. Bowel sounds are normal. She exhibits no distension and no mass. There is no tenderness.  Musculoskeletal: Normal range of motion. She exhibits edema.  9 cm skin tear left lower leg  Trace edema  Mild venous stasis changes  Lymphadenopathy:    She has no cervical adenopathy.  Neurological: She is alert. No cranial nerve deficit. She exhibits normal muscle tone. Coordination normal.  Oriented to person but not place or time.  Skin: Skin is warm and dry. No rash noted.  Nursing note and vitals reviewed.    ED Treatments / Results  DIAGNOSTIC STUDIES: Oxygen Saturation is 96% on RA, adequate by my  interpretation.  COORDINATION OF CARE:  12:25 AM Discussed treatment plan with pt at bedside and pt agreed to plan.  Procedures Procedures (including critical care time)  Medications Ordered in ED Medications - No data to display   Initial Impression / Assessment and Plan / ED Course  I have reviewed the triage vital signs and the nursing notes.  Pertinent labs & imaging results that were available during my care of the patient were reviewed by me and considered in my medical decision making (see chart for details).  Clinical Course    Patient with epidermal skin tear of left lower leg. Review of old records shows that last tetanus immunization was in 2013. Topical antibiotic and nonadherent dressing applied. Family advised on routine wound care.  Final Clinical Impressions(s) / ED Diagnoses   Final diagnoses:  Skin tear of left lower leg without complication, initial encounter   I personally performed the services described in this documentation, which was scribed in my presence. The recorded information has been reviewed and is accurate.    New Prescriptions New Prescriptions   No medications on file     Delora Fuel, MD [id number]

## 2016-05-07 ENCOUNTER — Ambulatory Visit (INDEPENDENT_AMBULATORY_CARE_PROVIDER_SITE_OTHER): Payer: Medicare Other | Admitting: Physician Assistant

## 2016-05-07 ENCOUNTER — Encounter: Payer: Self-pay | Admitting: Physician Assistant

## 2016-05-07 VITALS — BP 118/76 | HR 67 | Temp 98.6°F | Resp 16 | Ht 63.0 in | Wt 103.8 lb

## 2016-05-07 DIAGNOSIS — D649 Anemia, unspecified: Secondary | ICD-10-CM

## 2016-05-07 DIAGNOSIS — J45909 Unspecified asthma, uncomplicated: Secondary | ICD-10-CM

## 2016-05-07 DIAGNOSIS — Z0001 Encounter for general adult medical examination with abnormal findings: Secondary | ICD-10-CM

## 2016-05-07 DIAGNOSIS — G308 Other Alzheimer's disease: Secondary | ICD-10-CM | POA: Diagnosis not present

## 2016-05-07 DIAGNOSIS — R6889 Other general symptoms and signs: Secondary | ICD-10-CM | POA: Diagnosis not present

## 2016-05-07 DIAGNOSIS — I10 Essential (primary) hypertension: Secondary | ICD-10-CM | POA: Diagnosis not present

## 2016-05-07 DIAGNOSIS — E782 Mixed hyperlipidemia: Secondary | ICD-10-CM | POA: Diagnosis not present

## 2016-05-07 DIAGNOSIS — Z23 Encounter for immunization: Secondary | ICD-10-CM | POA: Diagnosis not present

## 2016-05-07 DIAGNOSIS — E559 Vitamin D deficiency, unspecified: Secondary | ICD-10-CM | POA: Diagnosis not present

## 2016-05-07 DIAGNOSIS — M8580 Other specified disorders of bone density and structure, unspecified site: Secondary | ICD-10-CM | POA: Diagnosis not present

## 2016-05-07 DIAGNOSIS — E441 Mild protein-calorie malnutrition: Secondary | ICD-10-CM | POA: Diagnosis not present

## 2016-05-07 DIAGNOSIS — R7303 Prediabetes: Secondary | ICD-10-CM | POA: Diagnosis not present

## 2016-05-07 DIAGNOSIS — I700 Atherosclerosis of aorta: Secondary | ICD-10-CM | POA: Diagnosis not present

## 2016-05-07 DIAGNOSIS — Z79899 Other long term (current) drug therapy: Secondary | ICD-10-CM

## 2016-05-07 DIAGNOSIS — F0280 Dementia in other diseases classified elsewhere without behavioral disturbance: Secondary | ICD-10-CM

## 2016-05-07 DIAGNOSIS — J438 Other emphysema: Secondary | ICD-10-CM

## 2016-05-07 DIAGNOSIS — Z0000 Encounter for general adult medical examination without abnormal findings: Secondary | ICD-10-CM | POA: Insufficient documentation

## 2016-05-07 DIAGNOSIS — L03116 Cellulitis of left lower limb: Secondary | ICD-10-CM

## 2016-05-07 DIAGNOSIS — G301 Alzheimer's disease with late onset: Secondary | ICD-10-CM

## 2016-05-07 LAB — CBC WITH DIFFERENTIAL/PLATELET
BASOS PCT: 0 %
Basophils Absolute: 0 cells/uL (ref 0–200)
EOS PCT: 2 %
Eosinophils Absolute: 134 cells/uL (ref 15–500)
HCT: 36.4 % (ref 35.0–45.0)
Hemoglobin: 12.4 g/dL (ref 11.7–15.5)
LYMPHS ABS: 1943 {cells}/uL (ref 850–3900)
LYMPHS PCT: 29 %
MCH: 33.1 pg — ABNORMAL HIGH (ref 27.0–33.0)
MCHC: 34.1 g/dL (ref 32.0–36.0)
MCV: 97.1 fL (ref 80.0–100.0)
MONOS PCT: 6 %
MPV: 10 fL (ref 7.5–12.5)
Monocytes Absolute: 402 cells/uL (ref 200–950)
Neutro Abs: 4221 cells/uL (ref 1500–7800)
Neutrophils Relative %: 63 %
PLATELETS: 296 10*3/uL (ref 140–400)
RBC: 3.75 MIL/uL — AB (ref 3.80–5.10)
RDW: 13.8 % (ref 11.0–15.0)
WBC: 6.7 10*3/uL (ref 3.8–10.8)

## 2016-05-07 MED ORDER — CEPHALEXIN 500 MG PO CAPS: 500.0000 mg | ORAL_CAPSULE | Freq: Four times a day (QID) | ORAL | 0 refills | Status: DC

## 2016-05-07 NOTE — Patient Instructions (Signed)
Abrasion An abrasion is a cut or scrape on the outer surface of your skin. An abrasion does not extend through all of the layers of your skin. It is important to care for your abrasion properly to prevent infection. CAUSES Most abrasions are caused by falling on or gliding across the ground or another surface. When your skin rubs on something, the outer and inner layer of skin rubs off.  SYMPTOMS A cut or scrape is the main symptom of this condition. The scrape may be bleeding, or it may appear red or pink. If there was an associated fall, there may be an underlying bruise. DIAGNOSIS An abrasion is diagnosed with a physical exam. TREATMENT Treatment for this condition depends on how large and deep the abrasion is. Usually, your abrasion will be cleaned with water and mild soap. This removes any dirt or debris that may be stuck. An antibiotic ointment may be applied to the abrasion to help prevent infection. A bandage (dressing) may be placed on the abrasion to keep it clean. You may also need a tetanus shot. HOME CARE INSTRUCTIONS Medicines  Take or apply medicines only as directed by your health care provider.  If you were prescribed an antibiotic ointment, finish all of it even if you start to feel better. Wound Care  Clean the wound with mild soap and water 2-3 times per day or as directed by your health care provider. Pat your wound dry with a clean towel. Do not rub it.  There are many different ways to close and cover a wound. Follow instructions from your health care provider about:  Wound care.  Dressing changes and removal.  Check your wound every day for signs of infection. Watch for:  Redness, swelling, or pain.  Fluid, blood, or pus. General Instructions  Keep the dressing dry as directed by your health care provider. Do not take baths, swim, use a hot tub, or do anything that would put your wound underwater until your health care provider approves.  If there is  swelling, raise (elevate) the injured area above the level of your heart while you are sitting or lying down.  Keep all follow-up visits as directed by your health care provider. This is important. SEEK MEDICAL CARE IF:  You received a tetanus shot and you have swelling, severe pain, redness, or bleeding at the injection site.  Your pain is not controlled with medicine.  You have increased redness, swelling, or pain at the site of your wound. SEEK IMMEDIATE MEDICAL CARE IF:  You have a red streak going away from your wound.  You have a fever.  You have fluid, blood, or pus coming from your wound.  You notice a bad smell coming from your wound or your dressing.   This information is not intended to replace advice given to you by your health care provider. Make sure you discuss any questions you have with your health care provider.   Document Released: 06/25/2005 Document Revised: 06/06/2015 Document Reviewed: 09/13/2014 Elsevier Interactive Patient Education 2016 [employer] Inc[employer]  

## 2016-05-07 NOTE — Progress Notes (Signed)
MEDICARE ANNUAL WELLNESS VISIT AND FOLLOW UP  Assessment:    Essential hypertension  DASH diet, exercise and monitor at home. Call if greater than 130/80.   Prediabetes Discussed general issues about diabetes pathophysiology and management., Educational material distributed., Suggested low cholesterol diet., Encouraged aerobic exercise., Discussed foot care., Reminded to get yearly retinal exam.  Hyperlipidemia  decrease fatty foods, increase activity.   EMPHYSEMA No issues at this time.   Asthma, unspecified asthma severity, uncomplicated controlled  SDAT Still able to perform some ADLS just very poor short term memory, will monitor closely, discussed possible need for assistance in the home versus assisted living, son is thinking about it.   Vitamin D deficiency   Medication management  Osteopenia Declines DEXA  Atherosclerosis of aorta Control blood pressure, cholesterol, glucose, increase exercise.   Anemia, unspecified anemia type History of anemia,check CBC  Skin abrasion with cellulitis Keflex, keep wound clean and dry, monitor closely  Mild malnutrition Check labs, ensure samples.   No future appointments.   Plan:   During the course of the visit the patient was educated and counseled about appropriate screening and preventive services including:    Pneumococcal vaccine   Influenza vaccine  Td vaccine  Screening electrocardiogram  Screening mammography  Bone densitometry screening  Colorectal cancer screening  Diabetes screening  Glaucoma screening  Nutrition counseling   Advanced directives: given info/requested   Subjective:   [name] is a 82 y.o. female who presents for Medicare Annual Wellness Visit and evaluation of skin tear on arm.   Patient went to the ER on 04/24/16 for 9 cm skin tear to left lower leg, her family was explained wound care and the area was dressed, she is here for follow up with her son. He has  been changing it every day, using OTC antibiotic. She is starting to have some discomfort and some redness at the skin tear.  She lives with a son, Legrand Como, Remo Lipps is here today and has daughter Lattie Haw, he states she can not use the telephone anymore. Refuses to wear a fall necklace.   Her feet and legs are constantly swelling. Son is saying that she is not eating well, has had some weight loss.  BMI is Body mass index is 18.39 kg/m., she occ drinks ensure.  Wt Readings from Last 3 Encounters:  05/07/16 103 lb 12.8 oz (47.1 kg)  03/12/16 106 lb (48.1 kg)  10/15/15 111 lb (50.3 kg)   Her blood pressure has been controlled at home, today their BP is BP: 118/76 She does not workout. She denies chest pain, dizziness. Very supportive family. She has COPD and has SOB today with weakness. Patient is poor historian.  She also has history of anemia, due to age wanted conservative treatment, we are still monitoring.  Lab Results  Component Value Date   WBC 6.1 10/15/2015   HGB 13.1 10/15/2015   HCT 38.0 10/15/2015   MCV 97.9 10/15/2015   PLT 302 10/15/2015    She is not on cholesterol medication and denies myalgias. Her cholesterol is at goal. The cholesterol last visit was:   Lab Results  Component Value Date   CHOL 219 (H) 10/15/2015   HDL 91 10/15/2015   LDLCALC 99 10/15/2015   TRIG 143 10/15/2015   CHOLHDL 2.4 10/15/2015   She has been working on diet and exercise for prediabetes, and denies polydipsia, polyuria and visual disturbances. Last A1C in the office was:  Lab Results  Component Value Date  HGBA1C 5.8 (H) 10/15/2015   Patient is on Vitamin D supplement. Lab Results  Component Value Date   VD25OH 34 04/05/2015     Names of Other Physician/Practitioners you currently use: 1. [facility]- here for primary care 2. Dr. Einar Gip, eye doctor, last visit 2015 3. Wears dentures- no dentist Patient Care Team: Unk Pinto, MD as PCP -  General (Internal Medicine) Minus Breeding, MD as Consulting Physician (Cardiology)  Medication Review No current outpatient prescriptions on file prior to visit.   No current facility-administered medications on file prior to visit.     Current Problems (verified) Patient Active Problem List   Diagnosis Date Noted  . Encounter for Medicare annual wellness exam 05/07/2016  . Mild malnutrition ([place]) 05/07/2016  . Osteopenia 04/05/2015  . Atherosclerosis of aorta ([place]) 04/05/2015  . Anemia 04/05/2015  . SDAT 01/30/2014  . Medication management 12/05/2013  . Hyperlipidemia   . Prediabetes   . Vitamin D deficiency   . Essential hypertension 11/29/2010  . EMPHYSEMA 11/29/2010  . Asthma 11/29/2010    Screening Tests Immunization History  Administered Date(s) Administered  . Influenza, High Dose Seasonal PF 06/14/2014  . Influenza-Unspecified 08/13/2013  . Pneumococcal-Unspecified 10/09/2006  . Td 09/08/2002, 09/07/2012  . Zoster 09/10/2012    Preventative care: Tetanus: 2013  Pneumovax: 2008  Prevnar 13: DUE  Flu vaccine: 2014  Zostavax: 2013   Pap: 2001 negative declines another MGM: 04/2015, will not get another due to age  DEXA: 08/2012 osteopenia declines another Colonoscopy: 2005  Will not get another due to age EGD: N/A  Echo 2011 mild MR, EF 55% Cardiolite negative 2008 CT chest 2012 CAD and COPD CXR 02/2014  Allergies Allergies  Allergen Reactions  . Codeine Nausea And Vomiting  . Desyrel [Trazodone]   . Penicillins     "childhood"  . Sulfa Antibiotics Nausea And Vomiting  . Theophyllines   . Ventolin [Albuterol]     tremor    SURGICAL HISTORY She  has a past surgical history that includes lumpectomy and radiation; Throat surgery; Appendectomy; and Repair Zenker's diverticula. FAMILY HISTORY Her family history includes Heart attack in her brother. SOCIAL HISTORY She  reports that she quit smoking about 17 years ago. Her smoking use included  Cigarettes. She has a 20.00 pack-year smoking history. She has never used smokeless tobacco. She reports that she does not drink alcohol or use drugs.   MEDICARE WELLNESS OBJECTIVES: Tobacco use: She does not smoke.  Patient is a former smoker. Alcohol Current alcohol use: none Physical activity: None Depression/mood screen:   Depression screen [facility] 2/9 05/07/2016  Decreased Interest 0  Down, Depressed, Hopeless 0  PHQ - 2 Score 0   ADLs:  In your present state of health, do you have any difficulty performing the following activities: 05/07/2016  Hearing? Y  Vision? N  Difficulty concentrating or making decisions? Y  Walking or climbing stairs? Y  Dressing or bathing? Y  Doing errands, shopping? Y  Preparing Food and eating ? N  Using the Toilet? N  In the past six months, have you accidently leaked urine? N  Do you have problems with loss of bowel control? N  Managing your Medications? N  Managing your Finances? N  Housekeeping or managing your Housekeeping? N  Some recent data might be hidden    Fall risk: Moderate Risk Cognitive Testing  Alert? Yes  Normal Appearance?Yes  Oriented to person? Yes  Place? No   Time? No  Recall of three objects?  0/3  Can perform simple calculations? No  Displays appropriate judgment?No  Can read the correct time from a watch face? No  EOL planning: Does patient have an advance directive?: No Would patient like information on creating an advanced directive?: Yes - Educational materials given   Review of Systems  Constitutional: Negative for chills, diaphoresis, fever, malaise/fatigue and weight loss.  HENT: Positive for hearing loss. Negative for congestion, ear discharge, ear pain, nosebleeds, sore throat and tinnitus.   Eyes: Negative.   Respiratory: Negative for cough, hemoptysis, sputum production, shortness of breath, wheezing and stridor.   Cardiovascular: Negative for chest pain, palpitations, orthopnea, claudication, leg swelling and  PND.  Gastrointestinal: Negative for abdominal pain, blood in stool, constipation, diarrhea, heartburn, melena, nausea and vomiting.  Genitourinary: Negative for dysuria, flank pain, frequency, hematuria and urgency.  Musculoskeletal: Positive for falls. Negative for back pain, joint pain, myalgias and neck pain.  Skin: Negative.  Negative for itching.       Skin abrasion  Neurological: Negative for dizziness, tingling, tremors, sensory change, speech change, focal weakness, seizures, loss of consciousness, weakness and headaches.  Psychiatric/Behavioral: Positive for memory loss. Negative for depression, substance abuse and suicidal ideas. The patient is not nervous/anxious and does not have insomnia.     Objective:   Today's Vitals   05/07/16 1027  BP: 118/76  Pulse: 67  Resp: 16  Temp: 98.6 F (37 C)  SpO2: 99%  Weight: 103 lb 12.8 oz (47.1 kg)  Height: 5\' 3"  (1.6 m)  PainSc: 5   PainLoc: Leg   Body mass index is 18.39 kg/m.  General appearance: alert, no distress, WD/WN,  female HEENT: normocephalic, sclerae anicteric, TMs pearly, nares patent, no discharge or erythema, pharynx normal Oral cavity: MMM, no lesions Neck: supple, no lymphadenopathy, no thyromegaly, no masses Heart: RRR, holosystolic murmur with mid clickbest at LSB with prominate S2 Lungs: Decreased breath sounds without rales, rhonchi.  Abdomen: +bs, soft, mild diffuse lower AB tenderness, non distended, no masses, no hepatomegaly, no splenomegaly Musculoskeletal: nontender, no swelling, no obvious deformity Extremities: 1-2+  edema, no cyanosis, no clubbing Pulses: 2+ symmetric, upper and lower extremities, normal cap refill Neurological: alert, oriented x 3, CN2-12 intact, strength decreased upper extremities and lower extremities, sensation normal throughout, DTRs 2+ throughout, no cerebellar signs, gait unsteady Skin: Ecchymosis left lateral/anterior leg, 5-6 cm skin tear with surrounds erythema, warmth  and swelling but no discharge.   Psychiatric: normal affect, behavior normal, pleasant  Breast: defer Gyn: defer Rectal: defer  Medicare Attestation I have personally reviewed: The patient's medical and social history Their use of alcohol, tobacco or illicit drugs Their current medications and supplements The patient's functional ability including ADLs,fall risks, home safety risks, cognitive, and hearing and visual impairment Diet and physical activities Evidence for depression or mood disorders  The patient's weight, height, BMI, and visual acuity have been recorded in the chart.  I have made referrals, counseling, and provided education to the patient based on review of the above and I have provided the patient with a written personalized care plan for preventive services.     Vicie Mutters, PA-C   05/07/2016

## 2016-05-08 LAB — BASIC METABOLIC PANEL WITH GFR
BUN: 8 mg/dL (ref 7–25)
CHLORIDE: 102 mmol/L (ref 98–110)
CO2: 28 mmol/L (ref 20–31)
CREATININE: 0.78 mg/dL (ref 0.60–0.88)
Calcium: 9.5 mg/dL (ref 8.6–10.4)
GFR, EST AFRICAN AMERICAN: 78 mL/min (ref >=60)
GFR, Est Non African American: 68 mL/min (ref >=60)
Glucose, Bld: 97 mg/dL (ref 65–99)
POTASSIUM: 4 mmol/L (ref 3.5–5.3)
SODIUM: 141 mmol/L (ref 135–146)

## 2016-05-08 LAB — HEPATIC FUNCTION PANEL
ALBUMIN: 3.9 g/dL (ref 3.6–5.1)
ALK PHOS: 59 U/L (ref 33–130)
ALT: 6 U/L (ref 6–29)
AST: 15 U/L (ref 10–35)
BILIRUBIN DIRECT: 0.2 mg/dL (ref <=0.2)
BILIRUBIN TOTAL: 0.9 mg/dL (ref 0.2–1.2)
Indirect Bilirubin: 0.7 mg/dL (ref 0.2–1.2)
Total Protein: 6.2 g/dL (ref 6.1–8.1)

## 2016-05-15 ENCOUNTER — Encounter: Payer: Self-pay | Admitting: Internal Medicine

## 2016-05-15 ENCOUNTER — Ambulatory Visit (INDEPENDENT_AMBULATORY_CARE_PROVIDER_SITE_OTHER): Payer: Medicare Other | Admitting: Internal Medicine

## 2016-05-15 VITALS — BP 132/70 | HR 81 | Temp 97.7°F | Resp 14 | Ht 63.0 in | Wt 102.8 lb

## 2016-05-15 DIAGNOSIS — T148 Other injury of unspecified body region: Secondary | ICD-10-CM | POA: Diagnosis not present

## 2016-05-15 DIAGNOSIS — L089 Local infection of the skin and subcutaneous tissue, unspecified: Secondary | ICD-10-CM

## 2016-05-15 DIAGNOSIS — T148XXA Other injury of unspecified body region, initial encounter: Principal | ICD-10-CM

## 2016-05-15 MED ORDER — DOXYCYCLINE HYCLATE 100 MG PO CAPS: 100.0000 mg | ORAL_CAPSULE | Freq: Two times a day (BID) | ORAL | 0 refills | Status: DC

## 2016-05-15 MED ORDER — FUROSEMIDE 20 MG PO TABS: 20.0000 mg | ORAL_TABLET | Freq: Every day | ORAL | 1 refills | Status: DC

## 2016-05-15 NOTE — Patient Instructions (Signed)
Please take the doxycycline twice daily with some form of food.    Please take lasix once in the morning.  Don't take it after 5 pm.  Put risers beneath the foot of the bedframe to help keep your feet elevated above your heart.    Please use a mixture of table sugar and iodine until it forms a thin paste and place this on the wound.  It will help dry the wound out and also help keep it from being infected.    Please use tylenol 500 mg three times a day as needed to help with pain.

## 2016-05-17 NOTE — Progress Notes (Signed)
Assessment and Plan:   1. Infected skin tear -doxycycline -iodine and sugar slurry q48hrs with bandage change -recheck wound in 1 week -if no improvement consider wound care center vs. Home health.   -lasix 20 mg to help with peripheral edema     HPI 82 y.o.female presents for 1 month follow up of skin tear on her right leg which she obtained getting into her vehicle.  She was seen by both ER physician and also by Estill Bamberg at our office.  She did have some minor redness and completed Keflex.  She has not had much improvement in her skin tear.  It is painful and mildly red.  She does get pain relief from tylenol.  She has had no drainage or pus per her son's report.  He is her main caregiver.    Past Medical History:  Diagnosis Date  . Anxiety   . Asthma   . Breast cancer ([place])   . COPD (chronic obstructive pulmonary disease) ([place])   . Depression   . Gastroesophageal reflux disease with hiatal hernia   . High cholesterol   . Hypertension   . Need for pneumococcal vaccine    had vaccine several times  . Osteoporosis   . Pneumonia    multiple  . Prediabetes   . Vitamin D deficiency      Allergies  Allergen Reactions  . Codeine Nausea And Vomiting  . Desyrel [Trazodone]   . Penicillins     "childhood"  . Sulfa Antibiotics Nausea And Vomiting  . Theophyllines   . Ventolin [Albuterol]     tremor      No current outpatient prescriptions on file prior to visit.   No current facility-administered medications on file prior to visit.     ROS: all negative except above.   Physical Exam: Filed Weights   05/15/16 1130  Weight: 102 lb 12.8 oz (46.6 kg)   BP 132/70   Pulse 81   Temp 97.7 F (36.5 C)   Resp 14   Ht 5\' 3"  (1.6 m)   Wt 102 lb 12.8 oz (46.6 kg)   SpO2 97%   BMI 18.21 kg/m  General Appearance: Well developed well nourished, non-toxic appearing in no apparent distress. Eyes: PERRLA, EOMs, conjunctiva w/ no swelling or erythema or discharge Sinuses: No  Frontal/maxillary tenderness ENT/Mouth: Ear canals clear without swelling or erythema.  TM's normal bilaterally with no retractions, bulging, or loss of landmarks.   Neck: Supple, thyroid normal, no notable JVD  Respiratory: Respiratory effort normal, Clear breath sounds anteriorly and posteriorly bilaterally without rales, rhonchi, wheezing or stridor. No retractions or accessory muscle usage. Cardio: RRR with no MRGs.   Abdomen: Soft, + BS.  Non tender, no guarding, rebound, hernias, masses.  Musculoskeletal: Full ROM, 5/5 strength, normal gait.  Skin: Warm, dry without rashes.  Skin tear 9 cm in size with some serous drainage.  Tender to palpation with some redness extending around the inferior edges of the wound.  No palpable warmth.   Bilateral 2+ peripheral edema Neuro: Awake and oriented X 3, Cranial nerves intact. Normal muscle tone, no cerebellar symptoms. Sensation intact.  Psych: normal affect, Insight and Judgment appropriate.     Starlyn Skeans, PA-C 9:15 PM [facility]

## 2016-05-26 ENCOUNTER — Ambulatory Visit: Payer: Self-pay | Admitting: Internal Medicine

## 2016-08-23 ENCOUNTER — Other Ambulatory Visit: Payer: Self-pay | Admitting: Internal Medicine

## 2016-09-05 ENCOUNTER — Emergency Department (HOSPITAL_COMMUNITY)
Admission: EM | Admit: 2016-09-05 | Discharge: 2016-09-05 | Disposition: A | Discharge status: Home / Self Care | Payer: Medicare Other | Attending: Emergency Medicine | Admitting: Emergency Medicine

## 2016-09-05 ENCOUNTER — Encounter (HOSPITAL_COMMUNITY): Payer: Self-pay | Admitting: Emergency Medicine

## 2016-09-05 ENCOUNTER — Emergency Department (HOSPITAL_COMMUNITY): Payer: Medicare Other

## 2016-09-05 DIAGNOSIS — G309 Alzheimer's disease, unspecified: Secondary | ICD-10-CM | POA: Diagnosis not present

## 2016-09-05 DIAGNOSIS — J449 Chronic obstructive pulmonary disease, unspecified: Secondary | ICD-10-CM | POA: Insufficient documentation

## 2016-09-05 DIAGNOSIS — Z79899 Other long term (current) drug therapy: Secondary | ICD-10-CM | POA: Insufficient documentation

## 2016-09-05 DIAGNOSIS — E876 Hypokalemia: Secondary | ICD-10-CM | POA: Diagnosis not present

## 2016-09-05 DIAGNOSIS — R4182 Altered mental status, unspecified: Secondary | ICD-10-CM | POA: Diagnosis not present

## 2016-09-05 DIAGNOSIS — Z87891 Personal history of nicotine dependence: Secondary | ICD-10-CM | POA: Diagnosis not present

## 2016-09-05 DIAGNOSIS — F0281 Dementia in other diseases classified elsewhere with behavioral disturbance: Secondary | ICD-10-CM | POA: Diagnosis not present

## 2016-09-05 DIAGNOSIS — I10 Essential (primary) hypertension: Secondary | ICD-10-CM | POA: Insufficient documentation

## 2016-09-05 HISTORY — DX: Unspecified dementia, unspecified severity, without behavioral disturbance, psychotic disturbance, mood disturbance, and anxiety: F03.90

## 2016-09-05 LAB — CBC WITH DIFFERENTIAL/PLATELET
BASOS ABS: 0 10*3/uL (ref 0.0–0.1)
BASOS PCT: 0 %
EOS ABS: 0.1 10*3/uL (ref 0.0–0.7)
Eosinophils Relative: 2 %
HEMATOCRIT: 38.5 % (ref 36.0–46.0)
HEMOGLOBIN: 12.3 g/dL (ref 12.0–15.0)
Lymphocytes Relative: 30 %
Lymphs Abs: 1.4 10*3/uL (ref 0.7–4.0)
MCH: 30.3 pg (ref 26.0–34.0)
MCHC: 31.9 g/dL (ref 30.0–36.0)
MCV: 94.8 fL (ref 78.0–100.0)
MONOS PCT: 8 %
Monocytes Absolute: 0.4 10*3/uL (ref 0.1–1.0)
NEUTROS ABS: 2.7 10*3/uL (ref 1.7–7.7)
NEUTROS PCT: 60 %
Platelets: 255 10*3/uL (ref 150–400)
RBC: 4.06 MIL/uL (ref 3.87–5.11)
RDW: 12.6 % (ref 11.5–15.5)
WBC: 4.5 10*3/uL (ref 4.0–10.5)

## 2016-09-05 LAB — ETHANOL

## 2016-09-05 LAB — COMPREHENSIVE METABOLIC PANEL
ALT: 10 U/L — AB (ref 14–54)
ANION GAP: 18 — AB (ref 5–15)
AST: 21 U/L (ref 15–41)
Albumin: 3.6 g/dL (ref 3.5–5.0)
Alkaline Phosphatase: 60 U/L (ref 38–126)
BUN: 11 mg/dL (ref 6–20)
CHLORIDE: 100 mmol/L — AB (ref 101–111)
CO2: 22 mmol/L (ref 22–32)
Calcium: 9.2 mg/dL (ref 8.9–10.3)
Creatinine, Ser: 0.82 mg/dL (ref 0.44–1.00)
Glucose, Bld: 105 mg/dL — ABNORMAL HIGH (ref 65–99)
POTASSIUM: 3 mmol/L — AB (ref 3.5–5.1)
SODIUM: 140 mmol/L (ref 135–145)
Total Bilirubin: 1.2 mg/dL (ref 0.3–1.2)
Total Protein: 6.6 g/dL (ref 6.5–8.1)

## 2016-09-05 LAB — URINALYSIS, ROUTINE W REFLEX MICROSCOPIC
Bacteria, UA: NONE SEEN
Bilirubin Urine: NEGATIVE
GLUCOSE, UA: NEGATIVE mg/dL
KETONES UR: NEGATIVE mg/dL
Leukocytes, UA: NEGATIVE
NITRITE: NEGATIVE
PH: 8 (ref 5.0–8.0)
Protein, ur: NEGATIVE mg/dL
Specific Gravity, Urine: 1.004 — ABNORMAL LOW (ref 1.005–1.030)
Squamous Epithelial / LPF: NONE SEEN

## 2016-09-05 LAB — CBG MONITORING, ED: Glucose-Capillary: 98 mg/dL (ref 65–99)

## 2016-09-05 LAB — ACETAMINOPHEN LEVEL

## 2016-09-05 LAB — SALICYLATE LEVEL

## 2016-09-05 MED ORDER — POTASSIUM CHLORIDE 20 MEQ/15ML (10%) PO SOLN
40.0000 meq | Freq: Once | ORAL | Status: AC
Start: 2016-09-05 — End: 2016-09-05
  Administered 2016-09-05: 40 meq via ORAL
  Filled 2016-09-05: qty 30

## 2016-09-05 MED ORDER — SODIUM CHLORIDE 0.9 % IV BOLUS (SEPSIS)
1000.0000 mL | Freq: Once | INTRAVENOUS | Status: AC
  Administered 2016-09-05: 1000 mL via INTRAVENOUS

## 2016-09-05 NOTE — ED Notes (Signed)
MD Little at the bedside  

## 2016-09-05 NOTE — ED Notes (Signed)
CSW, SW, MD, and this RN spoke with family about options for their mother. Family made aware we would help with placement if they desired, but we were not able to admit her due to the fact that she was not found to have any specific acute medical diagnosis.

## 2016-09-05 NOTE — ED Notes (Signed)
Pt returned from xray, placed back on monitor.

## 2016-09-05 NOTE — ED Provider Notes (Signed)
[facility] DEPT Provider Note   CSN: [id number] Arrival date & time: 09/05/16  [id number]     History   Chief Complaint Chief Complaint  Patient presents with  . Altered Mental Status    HPI [name] is a 82 y.o. female.  82yo M w/ PMH including dementia, COPD, HTN who p/w confusion. Family states that the patient has a history of dementia and has been progressively worsening with memory problems. She has had a sharper decline recently with confusion. Today, after her son left the house she was found outside by the police wandering in the road. She did not appear to have any injuries but family was concerned because she may have been outside for up to one hour. She has not had any recent fevers, vomiting, or cold symptoms. She has had a decline in ability to feed herself and dress herself and they are concerned about her ability to live at home.  LEVEL 5 CAVEAT DUE TO DEMENTIA   The history is provided by a relative.  Altered Mental Status      Past Medical History:  Diagnosis Date  . Anxiety   . Asthma   . Breast cancer ([place])   . COPD (chronic obstructive pulmonary disease) ([place])   . Dementia   . Depression   . Gastroesophageal reflux disease with hiatal hernia   . High cholesterol   . Hypertension   . Need for pneumococcal vaccine    had vaccine several times  . Osteoporosis   . Pneumonia    multiple  . Prediabetes   . Vitamin D deficiency     Patient Active Problem List   Diagnosis Date Noted  . Encounter for Medicare annual wellness exam 05/07/2016  . Mild malnutrition ([place]) 05/07/2016  . Osteopenia 04/05/2015  . Atherosclerosis of aorta ([place]) 04/05/2015  . Anemia 04/05/2015  . SDAT 01/30/2014  . Medication management 12/05/2013  . Hyperlipidemia   . Prediabetes   . Vitamin D deficiency   . Essential hypertension 11/29/2010  . EMPHYSEMA 11/29/2010  . Asthma 11/29/2010    Past Surgical History:  Procedure Laterality Date  . APPENDECTOMY     . lumpectomy and radiation     for breast cancer  . REPAIR ZENKER'S DIVERTICULA    . THROAT SURGERY      OB History    No data available       Home Medications    Prior to Admission medications   Medication Sig Start Date End Date Taking? Authorizing Provider  doxycycline (VIBRAMYCIN) 100 MG capsule Take 1 capsule (100 mg total) by mouth 2 (two) times daily. One po bid x 7 days Patient not taking: Reported on 09/05/2016 05/15/16   Loma Sousa Forcucci, PA-C  furosemide (LASIX) 20 MG tablet TAKE 1 TABLET (20 MG TOTAL) BY MOUTH DAILY. Patient not taking: Reported on 09/05/2016 08/23/16   Unk Pinto, MD    Family History Family History  Problem Relation Age of Onset  . Heart attack Brother     had in his 28s    Social History Social History  Substance Use Topics  . Smoking status: Former Smoker    Packs/day: 1.00    Years: 20.00    Types: Cigarettes    Quit date: 09/08/1998  . Smokeless tobacco: Never Used     Comment: quit 1988  . Alcohol use No     Allergies   Codeine; Desyrel [trazodone]; Penicillins; Sulfa antibiotics; Theophyllines; and Ventolin [albuterol]   Review of Systems Review  of Systems  Unable to perform ROS: Dementia     Physical Exam Updated Vital Signs BP 176/74 (BP Location: Right Arm)   Pulse 89   Temp 98.3 F (36.8 C) (Oral)   Resp 25   SpO2 98%   Physical Exam  Constitutional: No distress.  Thin, frail elderly woman awake, comfortable  HENT:  Head: Normocephalic and atraumatic.  Moist mucous membranes  Eyes: Conjunctivae are normal. Pupils are equal, round, and reactive to light.  Neck: Neck supple.  Cardiovascular: Normal rate and regular rhythm.   Murmur heard.  Systolic murmur is present with a grade of 2/6  Pulmonary/Chest: Effort normal and breath sounds normal.  Abdominal: Soft. Bowel sounds are normal. She exhibits no distension. There is no tenderness.  Musculoskeletal: She exhibits edema (trace BLE). She exhibits  no tenderness or deformity.  Neurological: She is alert.  Non-sensical speech, moving all 4 extremities  Skin: Skin is warm and dry.  Nursing note and vitals reviewed.    ED Treatments / Results  Labs (all labs ordered are listed, but only abnormal results are displayed) Labs Reviewed  COMPREHENSIVE METABOLIC PANEL - Abnormal; Notable for the following:       Result Value   Potassium 3.0 (*)    Chloride 100 (*)    Glucose, Bld 105 (*)    ALT 10 (*)    Anion gap 18 (*)    All other components within normal limits  ACETAMINOPHEN LEVEL - Abnormal; Notable for the following:    Acetaminophen (Tylenol), Serum <10 (*)    All other components within normal limits  URINALYSIS, ROUTINE W REFLEX MICROSCOPIC - Abnormal; Notable for the following:    Color, Urine STRAW (*)    Specific Gravity, Urine 1.004 (*)    Hgb urine dipstick SMALL (*)    All other components within normal limits  URINE CULTURE  ETHANOL  SALICYLATE LEVEL  CBC WITH DIFFERENTIAL/PLATELET  CBG MONITORING, ED    EKG  EKG Interpretation None       Radiology Dg Chest 2 View  Result Date: 09/05/2016 CLINICAL DATA:  Altered mental status EXAM: CHEST  2 VIEW COMPARISON:  03/07/2014 FINDINGS: The lungs are hyperinflated likely secondary to COPD. There is a calcified right lower lobe pulmonary nodule likely reflecting sequela prior granulomatous disease. There is no focal parenchymal opacity. There is no pleural effusion or pneumothorax. The heart and mediastinal contours are unremarkable. There is dense mitral annular calcification. There is a moderate size hiatal hernia. The osseous structures are unremarkable. IMPRESSION: No active cardiopulmonary disease. Electronically Signed   By: Kathreen Devoid   On: 09/05/2016 09:37    Procedures Procedures (including critical care time)  Medications Ordered in ED Medications  potassium chloride 20 MEQ/15ML (10%) solution 40 mEq (40 mEq Oral Given 09/05/16 1316)  sodium  chloride 0.9 % bolus 1,000 mL (0 mLs Intravenous Stopped 09/05/16 1316)     Initial Impression / Assessment and Plan / ED Course  I have reviewed the triage vital signs and the nursing notes.  Pertinent labs & imaging results that were available during my care of the patient were reviewed by me and considered in my medical decision making (see chart for details).  Clinical Course    PT w/ dementia w/ worsening decline recently Brought in after she was found wandering in the road this morning. She was comfortable on exam with reassuring vital signs. Initial temperature 97.7. No signs of trauma. Obtained above labs. K 3.0, Cl 100  and AG 18 without evidence of DKA. Gave IVF bolus and oral potassium. UA negative.  CXR negative. Pt comfortable On reexamination. No evidence of acute medical process such as infection. I contacted both facial work and case management and they had a long discussion with the patient's family regarding options. She does not meet criteria for admission here and unfortunately cannot be placed directly to facility from here because family states she cannot afford out-of-pocket expenses. Social work provided with information to contact local facilities and son will contact PCP to assist with appropriate Medicare/Medicaid paperwork. Discussed return precautions and patient discharged in satisfactory condition.  Final Clinical Impressions(s) / ED Diagnoses   Final diagnoses:  Alzheimer's dementia with behavioral disturbance, unspecified timing of dementia onset  Hypokalemia    New Prescriptions Discharge Medication List as of 09/05/2016  2:48 PM       Sharlett Iles, MD 09/05/16 1725

## 2016-09-05 NOTE — ED Notes (Signed)
Collected clean catch urine sample per Levada Dy, RN

## 2016-09-05 NOTE — ED Notes (Signed)
Pt transported to xray 

## 2016-09-05 NOTE — ED Notes (Signed)
Provided pt with decaffe coffee to drink per Levada Dy, RN

## 2016-09-05 NOTE — ED Triage Notes (Signed)
Pt was found standing in the middle of the road, , lives with her son , more confused than normal daughter states , no real injury ? Home placement

## 2016-09-05 NOTE — Discharge Planning (Signed)
EDCM, SW, MD, and Jarrett Soho, RN spoke with family about discharge options for their mother. Family made aware we would help with home health servicesif they desired, but they declined.  Family verbalized understanding that we are not able to admit pt due to the fact that she was not found to have any specific acute medical diagnosis.

## 2016-09-05 NOTE — ED Notes (Signed)
Waiting for SW

## 2016-09-05 NOTE — Progress Notes (Signed)
CSW engaged with Patient at bedside with MD, RN Case Manager, and Bedside RN. CSW provided Patient's family with list of [facility]. CSW explained that Medicare does not cover the costs of assisted living facilities or long-term care facilities. CSW explained that for ALF placement, Patient will need Medicaid. CSW provided Patient with information regarding Medicare's coverage for skilled nursing facilities including the required qualifying 3 night inpatient stay for coverage. CSW provided Patient's family with information for applying for special assistance medicaid. Patient's family reports that they cannot pay privately for a facility and are unable to pay for a private duty home agency either. They report that they were informed by "the lady in Triage that she (patient) cannot go home". Family inquired about what would happen if they just left Patient here at the hospital. CSW explained that CSW would have to file an adult protective services report for abandonment which would then lead to an investigation. CSW encouraged Patient's family to apply for special assistance medicaid as soon as possible. CSW also encouraged Patient's family to have Patient's PCP assist with placement as well. Patient's family declined home health services as they report that it "isnt' enough, she needs more care". CSW has provided all possible interventions at this time. CSW signing off. Please contact should new need(s) arise.          Lorrine Kin, MSW, LCSW [facility] ED/40M Clinical Social Worker [phone]

## 2016-09-05 NOTE — Progress Notes (Addendum)
CSW consult acknowledged re: Placement. Patient has Medicare insurance coverage which requires a qualifying 3 night inpatient stay. Patient will have to pay for facility placement privately. CSW to staff with RN case manager regarding home health services and private duty agency options for Patient as well.     Lorrine Kin, MSW, LCSW [facility] ED/20M Clinical Social Worker [phone]

## 2016-09-06 LAB — URINE CULTURE
CULTURE: NO GROWTH
Special Requests: NORMAL

## 2016-09-08 ENCOUNTER — Other Ambulatory Visit: Payer: Self-pay | Admitting: Internal Medicine

## 2016-09-08 ENCOUNTER — Telehealth: Payer: Self-pay | Admitting: *Deleted

## 2016-09-08 ENCOUNTER — Ambulatory Visit (INDEPENDENT_AMBULATORY_CARE_PROVIDER_SITE_OTHER): Payer: Medicare Other | Admitting: *Deleted

## 2016-09-08 DIAGNOSIS — Z111 Encounter for screening for respiratory tuberculosis: Secondary | ICD-10-CM | POA: Diagnosis not present

## 2016-09-08 NOTE — Telephone Encounter (Signed)
Spoke with the patient's daughter regarding her ER visit on 09/05/2016.   The daughter states they are trying to get the patient into an assisted living facility and she declined to bring the patient in for an ER follow up visit.  She states there was no medication changes and she will have the FL2 sent to our office.

## 2016-09-10 ENCOUNTER — Other Ambulatory Visit: Payer: Self-pay | Admitting: *Deleted

## 2016-09-10 MED ORDER — LORAZEPAM 0.5 MG PO TABS: ORAL_TABLET | ORAL | 0 refills | Status: DC

## 2016-09-15 DIAGNOSIS — E785 Hyperlipidemia, unspecified: Secondary | ICD-10-CM | POA: Diagnosis not present

## 2016-09-15 DIAGNOSIS — J449 Chronic obstructive pulmonary disease, unspecified: Secondary | ICD-10-CM | POA: Diagnosis not present

## 2016-09-15 DIAGNOSIS — E559 Vitamin D deficiency, unspecified: Secondary | ICD-10-CM | POA: Diagnosis not present

## 2016-09-15 DIAGNOSIS — I10 Essential (primary) hypertension: Secondary | ICD-10-CM | POA: Diagnosis not present

## 2016-09-15 DIAGNOSIS — G309 Alzheimer's disease, unspecified: Secondary | ICD-10-CM | POA: Diagnosis not present

## 2016-09-15 DIAGNOSIS — F0280 Dementia in other diseases classified elsewhere without behavioral disturbance: Secondary | ICD-10-CM | POA: Diagnosis not present

## 2016-10-20 DIAGNOSIS — J449 Chronic obstructive pulmonary disease, unspecified: Secondary | ICD-10-CM | POA: Diagnosis not present

## 2016-10-20 DIAGNOSIS — N390 Urinary tract infection, site not specified: Secondary | ICD-10-CM | POA: Diagnosis not present

## 2016-10-20 DIAGNOSIS — E559 Vitamin D deficiency, unspecified: Secondary | ICD-10-CM | POA: Diagnosis not present

## 2016-10-20 DIAGNOSIS — F0280 Dementia in other diseases classified elsewhere without behavioral disturbance: Secondary | ICD-10-CM | POA: Diagnosis not present

## 2016-10-20 DIAGNOSIS — E785 Hyperlipidemia, unspecified: Secondary | ICD-10-CM | POA: Diagnosis not present

## 2016-10-20 DIAGNOSIS — G309 Alzheimer's disease, unspecified: Secondary | ICD-10-CM | POA: Diagnosis not present

## 2016-10-20 DIAGNOSIS — I10 Essential (primary) hypertension: Secondary | ICD-10-CM | POA: Diagnosis not present

## 2016-10-29 DIAGNOSIS — J449 Chronic obstructive pulmonary disease, unspecified: Secondary | ICD-10-CM | POA: Diagnosis not present

## 2016-10-29 DIAGNOSIS — G309 Alzheimer's disease, unspecified: Secondary | ICD-10-CM | POA: Diagnosis not present

## 2016-10-29 DIAGNOSIS — Z79899 Other long term (current) drug therapy: Secondary | ICD-10-CM | POA: Diagnosis not present

## 2016-10-30 DIAGNOSIS — J449 Chronic obstructive pulmonary disease, unspecified: Secondary | ICD-10-CM | POA: Diagnosis not present

## 2016-10-30 DIAGNOSIS — F419 Anxiety disorder, unspecified: Secondary | ICD-10-CM | POA: Diagnosis not present

## 2016-10-30 DIAGNOSIS — G309 Alzheimer's disease, unspecified: Secondary | ICD-10-CM | POA: Diagnosis not present

## 2016-10-30 DIAGNOSIS — F0281 Dementia in other diseases classified elsewhere with behavioral disturbance: Secondary | ICD-10-CM | POA: Diagnosis not present

## 2016-10-31 DIAGNOSIS — J449 Chronic obstructive pulmonary disease, unspecified: Secondary | ICD-10-CM | POA: Diagnosis not present

## 2016-10-31 DIAGNOSIS — G309 Alzheimer's disease, unspecified: Secondary | ICD-10-CM | POA: Diagnosis not present

## 2016-11-01 DIAGNOSIS — G309 Alzheimer's disease, unspecified: Secondary | ICD-10-CM | POA: Diagnosis not present

## 2016-11-01 DIAGNOSIS — J449 Chronic obstructive pulmonary disease, unspecified: Secondary | ICD-10-CM | POA: Diagnosis not present

## 2016-11-05 DIAGNOSIS — J449 Chronic obstructive pulmonary disease, unspecified: Secondary | ICD-10-CM | POA: Diagnosis not present

## 2016-11-05 DIAGNOSIS — G309 Alzheimer's disease, unspecified: Secondary | ICD-10-CM | POA: Diagnosis not present

## 2016-11-06 DIAGNOSIS — J449 Chronic obstructive pulmonary disease, unspecified: Secondary | ICD-10-CM | POA: Diagnosis not present

## 2016-11-06 DIAGNOSIS — G309 Alzheimer's disease, unspecified: Secondary | ICD-10-CM | POA: Diagnosis not present

## 2016-11-07 ENCOUNTER — Ambulatory Visit: Payer: Self-pay | Admitting: Physician Assistant

## 2016-11-10 DIAGNOSIS — N390 Urinary tract infection, site not specified: Secondary | ICD-10-CM | POA: Diagnosis not present

## 2016-11-10 DIAGNOSIS — R54 Age-related physical debility: Secondary | ICD-10-CM | POA: Diagnosis not present

## 2016-11-10 DIAGNOSIS — E785 Hyperlipidemia, unspecified: Secondary | ICD-10-CM | POA: Diagnosis not present

## 2016-11-10 DIAGNOSIS — I10 Essential (primary) hypertension: Secondary | ICD-10-CM | POA: Diagnosis not present

## 2016-11-10 DIAGNOSIS — J449 Chronic obstructive pulmonary disease, unspecified: Secondary | ICD-10-CM | POA: Diagnosis not present

## 2016-11-10 DIAGNOSIS — R627 Adult failure to thrive: Secondary | ICD-10-CM | POA: Diagnosis not present

## 2016-11-10 DIAGNOSIS — E559 Vitamin D deficiency, unspecified: Secondary | ICD-10-CM | POA: Diagnosis not present

## 2016-11-10 DIAGNOSIS — G309 Alzheimer's disease, unspecified: Secondary | ICD-10-CM | POA: Diagnosis not present

## 2016-11-11 DIAGNOSIS — J449 Chronic obstructive pulmonary disease, unspecified: Secondary | ICD-10-CM | POA: Diagnosis not present

## 2016-11-11 DIAGNOSIS — G309 Alzheimer's disease, unspecified: Secondary | ICD-10-CM | POA: Diagnosis not present

## 2016-11-12 DIAGNOSIS — G309 Alzheimer's disease, unspecified: Secondary | ICD-10-CM | POA: Diagnosis not present

## 2016-11-12 DIAGNOSIS — J449 Chronic obstructive pulmonary disease, unspecified: Secondary | ICD-10-CM | POA: Diagnosis not present

## 2016-11-13 DIAGNOSIS — F0281 Dementia in other diseases classified elsewhere with behavioral disturbance: Secondary | ICD-10-CM | POA: Diagnosis not present

## 2016-11-13 DIAGNOSIS — F419 Anxiety disorder, unspecified: Secondary | ICD-10-CM | POA: Diagnosis not present

## 2016-11-13 DIAGNOSIS — G309 Alzheimer's disease, unspecified: Secondary | ICD-10-CM | POA: Diagnosis not present

## 2016-11-18 DIAGNOSIS — G309 Alzheimer's disease, unspecified: Secondary | ICD-10-CM | POA: Diagnosis not present

## 2016-11-18 DIAGNOSIS — J449 Chronic obstructive pulmonary disease, unspecified: Secondary | ICD-10-CM | POA: Diagnosis not present

## 2016-11-20 DIAGNOSIS — J449 Chronic obstructive pulmonary disease, unspecified: Secondary | ICD-10-CM | POA: Diagnosis not present

## 2016-11-20 DIAGNOSIS — G309 Alzheimer's disease, unspecified: Secondary | ICD-10-CM | POA: Diagnosis not present

## 2016-11-23 DIAGNOSIS — J449 Chronic obstructive pulmonary disease, unspecified: Secondary | ICD-10-CM | POA: Diagnosis not present

## 2016-11-23 DIAGNOSIS — G309 Alzheimer's disease, unspecified: Secondary | ICD-10-CM | POA: Diagnosis not present

## 2016-11-25 DIAGNOSIS — G309 Alzheimer's disease, unspecified: Secondary | ICD-10-CM | POA: Diagnosis not present

## 2016-11-25 DIAGNOSIS — J449 Chronic obstructive pulmonary disease, unspecified: Secondary | ICD-10-CM | POA: Diagnosis not present

## 2016-11-26 DIAGNOSIS — G309 Alzheimer's disease, unspecified: Secondary | ICD-10-CM | POA: Diagnosis not present

## 2016-11-26 DIAGNOSIS — F0281 Dementia in other diseases classified elsewhere with behavioral disturbance: Secondary | ICD-10-CM | POA: Diagnosis not present

## 2016-11-26 DIAGNOSIS — F419 Anxiety disorder, unspecified: Secondary | ICD-10-CM | POA: Diagnosis not present

## 2016-11-27 DIAGNOSIS — J449 Chronic obstructive pulmonary disease, unspecified: Secondary | ICD-10-CM | POA: Diagnosis not present

## 2016-11-27 DIAGNOSIS — G309 Alzheimer's disease, unspecified: Secondary | ICD-10-CM | POA: Diagnosis not present

## 2016-12-02 DIAGNOSIS — G309 Alzheimer's disease, unspecified: Secondary | ICD-10-CM | POA: Diagnosis not present

## 2016-12-02 DIAGNOSIS — J449 Chronic obstructive pulmonary disease, unspecified: Secondary | ICD-10-CM | POA: Diagnosis not present

## 2016-12-04 DIAGNOSIS — G309 Alzheimer's disease, unspecified: Secondary | ICD-10-CM | POA: Diagnosis not present

## 2016-12-04 DIAGNOSIS — J449 Chronic obstructive pulmonary disease, unspecified: Secondary | ICD-10-CM | POA: Diagnosis not present

## 2016-12-08 DIAGNOSIS — E785 Hyperlipidemia, unspecified: Secondary | ICD-10-CM | POA: Diagnosis not present

## 2016-12-08 DIAGNOSIS — E559 Vitamin D deficiency, unspecified: Secondary | ICD-10-CM | POA: Diagnosis not present

## 2016-12-08 DIAGNOSIS — J449 Chronic obstructive pulmonary disease, unspecified: Secondary | ICD-10-CM | POA: Diagnosis not present

## 2016-12-08 DIAGNOSIS — G309 Alzheimer's disease, unspecified: Secondary | ICD-10-CM | POA: Diagnosis not present

## 2016-12-08 DIAGNOSIS — I10 Essential (primary) hypertension: Secondary | ICD-10-CM | POA: Diagnosis not present

## 2016-12-08 DIAGNOSIS — R54 Age-related physical debility: Secondary | ICD-10-CM | POA: Diagnosis not present

## 2016-12-08 DIAGNOSIS — N390 Urinary tract infection, site not specified: Secondary | ICD-10-CM | POA: Diagnosis not present

## 2016-12-08 DIAGNOSIS — R627 Adult failure to thrive: Secondary | ICD-10-CM | POA: Diagnosis not present

## 2016-12-09 DIAGNOSIS — J449 Chronic obstructive pulmonary disease, unspecified: Secondary | ICD-10-CM | POA: Diagnosis not present

## 2016-12-09 DIAGNOSIS — G309 Alzheimer's disease, unspecified: Secondary | ICD-10-CM | POA: Diagnosis not present

## 2016-12-16 DIAGNOSIS — R0689 Other abnormalities of breathing: Secondary | ICD-10-CM | POA: Diagnosis not present

## 2016-12-16 DIAGNOSIS — R05 Cough: Secondary | ICD-10-CM | POA: Diagnosis not present

## 2016-12-16 DIAGNOSIS — J449 Chronic obstructive pulmonary disease, unspecified: Secondary | ICD-10-CM | POA: Diagnosis not present

## 2016-12-16 DIAGNOSIS — G309 Alzheimer's disease, unspecified: Secondary | ICD-10-CM | POA: Diagnosis not present

## 2016-12-17 ENCOUNTER — Inpatient Hospital Stay (HOSPITAL_COMMUNITY)
Admission: EM | Admit: 2016-12-17 | Discharge: 2016-12-20 | DRG: 194 | Disposition: A | Discharge status: Home / Self Care | Payer: Medicare Other | Attending: Internal Medicine | Admitting: Internal Medicine

## 2016-12-17 ENCOUNTER — Encounter (HOSPITAL_COMMUNITY): Payer: Self-pay | Admitting: Emergency Medicine

## 2016-12-17 ENCOUNTER — Emergency Department (HOSPITAL_COMMUNITY): Payer: Medicare Other

## 2016-12-17 ENCOUNTER — Ambulatory Visit: Payer: Self-pay | Admitting: Physician Assistant

## 2016-12-17 DIAGNOSIS — I10 Essential (primary) hypertension: Secondary | ICD-10-CM | POA: Diagnosis present

## 2016-12-17 DIAGNOSIS — E7800 Pure hypercholesterolemia, unspecified: Secondary | ICD-10-CM | POA: Diagnosis present

## 2016-12-17 DIAGNOSIS — K219 Gastro-esophageal reflux disease without esophagitis: Secondary | ICD-10-CM | POA: Diagnosis present

## 2016-12-17 DIAGNOSIS — J189 Pneumonia, unspecified organism: Secondary | ICD-10-CM | POA: Diagnosis present

## 2016-12-17 DIAGNOSIS — E441 Mild protein-calorie malnutrition: Secondary | ICD-10-CM | POA: Diagnosis not present

## 2016-12-17 DIAGNOSIS — R0602 Shortness of breath: Secondary | ICD-10-CM | POA: Diagnosis not present

## 2016-12-17 DIAGNOSIS — Z853 Personal history of malignant neoplasm of breast: Secondary | ICD-10-CM

## 2016-12-17 DIAGNOSIS — I248 Other forms of acute ischemic heart disease: Secondary | ICD-10-CM | POA: Diagnosis present

## 2016-12-17 DIAGNOSIS — J101 Influenza due to other identified influenza virus with other respiratory manifestations: Secondary | ICD-10-CM | POA: Diagnosis not present

## 2016-12-17 DIAGNOSIS — D649 Anemia, unspecified: Secondary | ICD-10-CM | POA: Diagnosis present

## 2016-12-17 DIAGNOSIS — J440 Chronic obstructive pulmonary disease with acute lower respiratory infection: Secondary | ICD-10-CM | POA: Diagnosis not present

## 2016-12-17 DIAGNOSIS — K449 Diaphragmatic hernia without obstruction or gangrene: Secondary | ICD-10-CM | POA: Diagnosis present

## 2016-12-17 DIAGNOSIS — Z515 Encounter for palliative care: Secondary | ICD-10-CM

## 2016-12-17 DIAGNOSIS — F0280 Dementia in other diseases classified elsewhere without behavioral disturbance: Secondary | ICD-10-CM | POA: Diagnosis present

## 2016-12-17 DIAGNOSIS — E782 Mixed hyperlipidemia: Secondary | ICD-10-CM | POA: Diagnosis present

## 2016-12-17 DIAGNOSIS — R7989 Other specified abnormal findings of blood chemistry: Secondary | ICD-10-CM | POA: Diagnosis present

## 2016-12-17 DIAGNOSIS — Z880 Allergy status to penicillin: Secondary | ICD-10-CM

## 2016-12-17 DIAGNOSIS — Z23 Encounter for immunization: Secondary | ICD-10-CM

## 2016-12-17 DIAGNOSIS — R05 Cough: Secondary | ICD-10-CM | POA: Diagnosis not present

## 2016-12-17 DIAGNOSIS — R627 Adult failure to thrive: Secondary | ICD-10-CM | POA: Diagnosis present

## 2016-12-17 DIAGNOSIS — R748 Abnormal levels of other serum enzymes: Secondary | ICD-10-CM | POA: Diagnosis not present

## 2016-12-17 DIAGNOSIS — J1000 Influenza due to other identified influenza virus with unspecified type of pneumonia: Principal | ICD-10-CM | POA: Diagnosis present

## 2016-12-17 DIAGNOSIS — Z681 Body mass index (BMI) 19 or less, adult: Secondary | ICD-10-CM | POA: Diagnosis not present

## 2016-12-17 DIAGNOSIS — J181 Lobar pneumonia, unspecified organism: Secondary | ICD-10-CM

## 2016-12-17 DIAGNOSIS — Z8249 Family history of ischemic heart disease and other diseases of the circulatory system: Secondary | ICD-10-CM

## 2016-12-17 DIAGNOSIS — M810 Age-related osteoporosis without current pathological fracture: Secondary | ICD-10-CM | POA: Diagnosis present

## 2016-12-17 DIAGNOSIS — G309 Alzheimer's disease, unspecified: Secondary | ICD-10-CM | POA: Diagnosis present

## 2016-12-17 DIAGNOSIS — R778 Other specified abnormalities of plasma proteins: Secondary | ICD-10-CM | POA: Diagnosis present

## 2016-12-17 DIAGNOSIS — Z87891 Personal history of nicotine dependence: Secondary | ICD-10-CM

## 2016-12-17 DIAGNOSIS — Z888 Allergy status to other drugs, medicaments and biological substances status: Secondary | ICD-10-CM

## 2016-12-17 DIAGNOSIS — E785 Hyperlipidemia, unspecified: Secondary | ICD-10-CM | POA: Diagnosis present

## 2016-12-17 DIAGNOSIS — Z882 Allergy status to sulfonamides status: Secondary | ICD-10-CM

## 2016-12-17 DIAGNOSIS — Z66 Do not resuscitate: Secondary | ICD-10-CM | POA: Diagnosis present

## 2016-12-17 DIAGNOSIS — Z7189 Other specified counseling: Secondary | ICD-10-CM

## 2016-12-17 DIAGNOSIS — Z885 Allergy status to narcotic agent status: Secondary | ICD-10-CM

## 2016-12-17 DIAGNOSIS — R7303 Prediabetes: Secondary | ICD-10-CM | POA: Diagnosis present

## 2016-12-17 DIAGNOSIS — R197 Diarrhea, unspecified: Secondary | ICD-10-CM | POA: Diagnosis present

## 2016-12-17 DIAGNOSIS — B999 Unspecified infectious disease: Secondary | ICD-10-CM | POA: Diagnosis not present

## 2016-12-17 DIAGNOSIS — I2510 Atherosclerotic heart disease of native coronary artery without angina pectoris: Secondary | ICD-10-CM | POA: Diagnosis present

## 2016-12-17 LAB — URINALYSIS, ROUTINE W REFLEX MICROSCOPIC
Bacteria, UA: NONE SEEN
Bilirubin Urine: NEGATIVE
Glucose, UA: NEGATIVE mg/dL
Hgb urine dipstick: NEGATIVE
KETONES UR: 5 mg/dL — AB
Nitrite: NEGATIVE
PH: 6 (ref 5.0–8.0)
Protein, ur: NEGATIVE mg/dL
Specific Gravity, Urine: 1.015 (ref 1.005–1.030)

## 2016-12-17 LAB — PROCALCITONIN: PROCALCITONIN: 0.15 ng/mL

## 2016-12-17 LAB — CBC
HCT: 37.9 % (ref 36.0–46.0)
HEMOGLOBIN: 12.5 g/dL (ref 12.0–15.0)
MCH: 32.2 pg (ref 26.0–34.0)
MCHC: 33 g/dL (ref 30.0–36.0)
MCV: 97.7 fL (ref 78.0–100.0)
Platelets: 199 10*3/uL (ref 150–400)
RBC: 3.88 MIL/uL (ref 3.87–5.11)
RDW: 13.9 % (ref 11.5–15.5)
WBC: 3.4 10*3/uL — ABNORMAL LOW (ref 4.0–10.5)

## 2016-12-17 LAB — INFLUENZA PANEL BY PCR (TYPE A & B)
INFLAPCR: POSITIVE — AB
INFLBPCR: NEGATIVE

## 2016-12-17 LAB — BASIC METABOLIC PANEL
ANION GAP: 12 (ref 5–15)
BUN: 25 mg/dL — ABNORMAL HIGH (ref 6–20)
CO2: 26 mmol/L (ref 22–32)
Calcium: 9.3 mg/dL (ref 8.9–10.3)
Chloride: 101 mmol/L (ref 101–111)
Creatinine, Ser: 0.97 mg/dL (ref 0.44–1.00)
GFR, EST AFRICAN AMERICAN: 58 mL/min — AB (ref >60)
GFR, EST NON AFRICAN AMERICAN: 50 mL/min — AB (ref >60)
Glucose, Bld: 95 mg/dL (ref 65–99)
Potassium: 4.1 mmol/L (ref 3.5–5.1)
Sodium: 139 mmol/L (ref 135–145)

## 2016-12-17 LAB — LACTIC ACID, PLASMA: LACTIC ACID, VENOUS: 1.2 mmol/L (ref 0.5–1.9)

## 2016-12-17 LAB — MRSA PCR SCREENING: MRSA by PCR: NEGATIVE

## 2016-12-17 LAB — STREP PNEUMONIAE URINARY ANTIGEN: STREP PNEUMO URINARY ANTIGEN: NEGATIVE

## 2016-12-17 LAB — TROPONIN I: TROPONIN I: 0.04 ng/mL — AB (ref <0.03)

## 2016-12-17 MED ORDER — INFLUENZA VAC SPLIT QUAD 0.5 ML IM SUSY
0.5000 mL | PREFILLED_SYRINGE | INTRAMUSCULAR | Status: AC
  Administered 2016-12-18: 0.5 mL via INTRAMUSCULAR
  Filled 2016-12-17: qty 0.5

## 2016-12-17 MED ORDER — DOXYCYCLINE HYCLATE 100 MG PO TABS
100.0000 mg | ORAL_TABLET | Freq: Two times a day (BID) | ORAL | Status: DC
  Administered 2016-12-17 – 2016-12-20 (×6): 100 mg via ORAL
  Filled 2016-12-17 (×6): qty 1

## 2016-12-17 MED ORDER — ONDANSETRON HCL 4 MG/2ML IJ SOLN: 4.0000 mg | Freq: Four times a day (QID) | INTRAMUSCULAR | Status: DC | PRN

## 2016-12-17 MED ORDER — ENSURE ENLIVE PO LIQD
237.0000 mL | ORAL | Status: DC
  Administered 2016-12-18 – 2016-12-20 (×4): 237 mL via ORAL

## 2016-12-17 MED ORDER — ENOXAPARIN SODIUM 40 MG/0.4ML ~~LOC~~ SOLN
40.0000 mg | SUBCUTANEOUS | Status: DC
Start: 2016-12-18 — End: 2016-12-20
  Administered 2016-12-18 – 2016-12-20 (×3): 40 mg via SUBCUTANEOUS
  Filled 2016-12-17 (×3): qty 0.4

## 2016-12-17 MED ORDER — DOXYCYCLINE HYCLATE 100 MG PO TABS
100.0000 mg | ORAL_TABLET | Freq: Once | ORAL | Status: AC
  Administered 2016-12-17: 100 mg via ORAL
  Filled 2016-12-17: qty 1

## 2016-12-17 MED ORDER — OSELTAMIVIR PHOSPHATE 75 MG PO CAPS
75.0000 mg | ORAL_CAPSULE | Freq: Once | ORAL | Status: DC
  Filled 2016-12-17: qty 1

## 2016-12-17 MED ORDER — ACETAMINOPHEN 650 MG RE SUPP: 650.0000 mg | Freq: Four times a day (QID) | RECTAL | Status: DC | PRN

## 2016-12-17 MED ORDER — DEXTROSE 5 % IV SOLN
1.0000 g | INTRAVENOUS | Status: DC
  Administered 2016-12-18 – 2016-12-20 (×3): 1 g via INTRAVENOUS
  Filled 2016-12-17 (×3): qty 10

## 2016-12-17 MED ORDER — SODIUM CHLORIDE 0.9 % IV SOLN
INTRAVENOUS | Status: AC
  Administered 2016-12-17: 18:00:00 via INTRAVENOUS

## 2016-12-17 MED ORDER — SODIUM CHLORIDE 0.9 % IV BOLUS (SEPSIS)
1000.0000 mL | Freq: Once | INTRAVENOUS | Status: AC
  Administered 2016-12-17: 1000 mL via INTRAVENOUS

## 2016-12-17 MED ORDER — ACETAMINOPHEN 325 MG PO TABS: 650.0000 mg | ORAL_TABLET | Freq: Four times a day (QID) | ORAL | Status: DC | PRN

## 2016-12-17 MED ORDER — ALBUTEROL SULFATE (5 MG/ML) 0.5% IN NEBU: 2.5000 mg | INHALATION_SOLUTION | RESPIRATORY_TRACT | Status: DC

## 2016-12-17 MED ORDER — OSELTAMIVIR PHOSPHATE 30 MG PO CAPS
30.0000 mg | ORAL_CAPSULE | Freq: Every day | ORAL | Status: DC
  Administered 2016-12-17 – 2016-12-20 (×4): 30 mg via ORAL
  Filled 2016-12-17 (×4): qty 1

## 2016-12-17 MED ORDER — LORAZEPAM 2 MG/ML IJ SOLN
0.5000 mg | Freq: Once | INTRAMUSCULAR | Status: AC
  Administered 2016-12-17: 0.5 mg via INTRAVENOUS
  Filled 2016-12-17: qty 1

## 2016-12-17 MED ORDER — ALBUTEROL SULFATE (2.5 MG/3ML) 0.083% IN NEBU
2.5000 mg | INHALATION_SOLUTION | RESPIRATORY_TRACT | Status: DC
  Administered 2016-12-17 – 2016-12-18 (×2): 2.5 mg via RESPIRATORY_TRACT
  Filled 2016-12-17 (×3): qty 3

## 2016-12-17 MED ORDER — DEXTROSE 5 % IV SOLN
1.0000 g | Freq: Once | INTRAVENOUS | Status: AC
  Administered 2016-12-17: 1 g via INTRAVENOUS
  Filled 2016-12-17: qty 10

## 2016-12-17 MED ORDER — ONDANSETRON HCL 4 MG PO TABS: 4.0000 mg | ORAL_TABLET | Freq: Four times a day (QID) | ORAL | Status: DC | PRN

## 2016-12-17 MED ORDER — ENSURE ENLIVE PO LIQD: 237.0000 mL | Freq: Two times a day (BID) | ORAL | Status: DC

## 2016-12-17 NOTE — Discharge Planning (Signed)
Pt currently active with [facility].

## 2016-12-17 NOTE — H&P (Signed)
History and Physical    [name] [id number] DOB: 02-03-1925 DOA: 12/17/2016  PCP: Alesia Richards, MD Patient coming from: [facility]  Chief Complaint: cough  HPI: [name] is a somewhat demented, HOH 83 y.o. female with medical history significant for attention, dementia, hyperlipidemia, COPD not on home oxygen, vitamin D deficiency see presents from facility with chief complaint of cough. Initial evaluation includes chest x-ray concerning for pneumonia and positive influenza panel.  Information is obtained from the patient and the family is at the bedside noting that information from patient may be unreliable due to dementia. Family reports 2 day history of moist productive coughing. Yesterday she was evaluated by facility and Levaquin was started. Associated symptoms include decreased oral intake and loose stool. No reports of headache fever chills nausea vomiting. Patient denies chest pain palpitations lower extremity edema. She denies abdominal pain nausea vomiting. She denies dysuria hematuria frequency or urgency. Of note patient is hospice patient.  Family indicates they were told hospitalists had "revoked" since she was sent to the hospital. Discussed comfort care vs palliative care with family.    ED Course:  Max temp 99.6 rectally patient's hemodynamically stable and not hypoxic. patient is provided with 1 L of normal saline Rocephin and doxycycline.  Review of Systems: As per HPI otherwise 10 point review of systems negative.   Ambulatory Status: Generalized weakness over the last several days. Uses walker  Past Medical History:  Diagnosis Date  . Anxiety   . Asthma   . Breast cancer ([place])   . COPD (chronic obstructive pulmonary disease) ([place])   . Dementia   . Depression   . Gastroesophageal reflux disease with hiatal hernia   . High cholesterol   . Hypertension   . Need for pneumococcal vaccine    had vaccine several times  .  Osteoporosis   . Pneumonia    multiple  . Prediabetes   . Vitamin D deficiency     Past Surgical History:  Procedure Laterality Date  . APPENDECTOMY    . lumpectomy and radiation     for breast cancer  . REPAIR ZENKER'S DIVERTICULA    . THROAT SURGERY      Social History   Social History  . Marital status: Widowed    Spouse name: N/A  . Number of children: N/A  . Years of education: N/A   Occupational History  . Not on file.   Social History Main Topics  . Smoking status: Former Smoker    Packs/day: 1.00    Years: 20.00    Types: Cigarettes    Quit date: 09/08/1998  . Smokeless tobacco: Never Used     Comment: quit 1988  . Alcohol use No  . Drug use: No  . Sexual activity: Not on file   Other Topics Concern  . Not on file   Social History Narrative   She lives with her son.    Lives at a facility Allergies  Allergen Reactions  . Codeine Nausea And Vomiting  . Sulfa Antibiotics Nausea And Vomiting  . Ventolin [Albuterol] Other (See Comments)    tremor  . Desyrel [Trazodone] Other (See Comments)  . Penicillins Other (See Comments)    "childhood"  . Theophyllines Other (See Comments)    Family History  Problem Relation Age of Onset  . Heart attack Brother     had in his 38s    Prior to Admission medications   Medication Sig Start Date End Date Taking?  Authorizing Provider  LORazepam (ATIVAN) 0.5 MG tablet Take 1 tablet every 4 hours PRN for agitation. 09/10/16   Unk Pinto, MD    Physical Exam: Vitals:   12/17/16 1200 12/17/16 1230 12/17/16 1300 12/17/16 1530  BP: (!) 153/78 (!) 152/88 (!) 169/90 (!) 162/79  Pulse: 87  70 81  Resp:      Temp:      TempSrc:      SpO2: 94% 96% 96% 94%  Weight:         General:  Appears Quite thin and frail no acute distress Eyes:  PERRL, EOMI, normal lids, iris ENT:  grossly normal hearing, lips & tongue, because membranes of her mouth are pink slightly dry Neck:  no LAD, masses or  thyromegaly Cardiovascular:  RRR, no m/r/g. No LE edema.  Respiratory:  Mild increased work of breathing with conversation. Respirations somewhat shallow. Breath sounds distant diffuse rhonchi faint bilateral crackles in bases no wheeze, very poor cough effort Abdomen:  soft, ntnd,  Skin:  no rash or induration seen on limited exam Musculoskeletal:  grossly normal tone BUE/BLE, good ROM, no bony abnormality Psychiatric:  grossly normal mood and affect, speech fluent and appropriate, AOx3 Neurologic:  CN 2-12 grossly intact, moves all extremities in coordinated fashion, sensation intact  Labs on Admission: I have personally reviewed following labs and imaging studies  CBC:  Recent Labs Lab 12/17/16 1049  WBC 3.4*  HGB 12.5  HCT 37.9  MCV 97.7  PLT 950   Basic Metabolic Panel:  Recent Labs Lab 12/17/16 1049  NA 139  K 4.1  CL 101  CO2 26  GLUCOSE 95  BUN 25*  CREATININE 0.97  CALCIUM 9.3   GFR: CrCl cannot be calculated (Unknown ideal weight.). Liver Function Tests: No results for input(s): AST, ALT, ALKPHOS, BILITOT, PROT, ALBUMIN in the last 168 hours. No results for input(s): LIPASE, AMYLASE in the last 168 hours. No results for input(s): AMMONIA in the last 168 hours. Coagulation Profile: No results for input(s): INR, PROTIME in the last 168 hours. Cardiac Enzymes:  Recent Labs Lab 12/17/16 1049  TROPONINI 0.04*   BNP (last 3 results) No results for input(s): PROBNP in the last 8760 hours. HbA1C: No results for input(s): HGBA1C in the last 72 hours. CBG: No results for input(s): GLUCAP in the last 168 hours. Lipid Profile: No results for input(s): CHOL, HDL, LDLCALC, TRIG, CHOLHDL, LDLDIRECT in the last 72 hours. Thyroid Function Tests: No results for input(s): TSH, T4TOTAL, FREET4, T3FREE, THYROIDAB in the last 72 hours. Anemia Panel: No results for input(s): VITAMINB12, FOLATE, FERRITIN, TIBC, IRON, RETICCTPCT in the last 72 hours. Urine analysis:     Component Value Date/Time   COLORURINE STRAW (A) 09/05/2016 1156   APPEARANCEUR CLEAR 09/05/2016 1156   LABSPEC 1.004 (L) 09/05/2016 1156   PHURINE 8.0 09/05/2016 1156   GLUCOSEU NEGATIVE 09/05/2016 1156   HGBUR SMALL (A) 09/05/2016 1156   BILIRUBINUR NEGATIVE 09/05/2016 1156   KETONESUR NEGATIVE 09/05/2016 1156   PROTEINUR NEGATIVE 09/05/2016 1156   UROBILINOGEN 0.2 04/05/2015 1548   NITRITE NEGATIVE 09/05/2016 1156   LEUKOCYTESUR NEGATIVE 09/05/2016 1156    Creatinine Clearance: CrCl cannot be calculated (Unknown ideal weight.).  Sepsis Labs: @LABRCNTIP (procalcitonin:4,lacticidven:4) )No results found for this or any previous visit (from the past 240 hour(s)).   Radiological Exams on Admission: Dg Chest 2 View  Result Date: 12/17/2016 CLINICAL DATA:  Cough, congestion and short of breath for 3 days. EXAM: CHEST  2 VIEW COMPARISON:  09/05/2006 FINDINGS: The heart remains mildly enlarged. Lungs are hyperaerated. Calcified densities at the right lung base are stable. Moderate hiatal hernia is stable. Oval density projects over the left upper lung zone. Considerations include external object or pulmonary nodule. There is a similar density at the right lung base these measure approximately 2 cm in size. No pneumothorax. No pleural effusion. Osteopenia. Stable spine. IMPRESSION: There are new oval densities in the left upper lung zone and right lung base which may be external objects. Pulmonary nodule cannot be excluded. Electronically Signed   By: Marybelle Killings M.D.   On: 12/17/2016 11:13    EKG: Independently reviewed. Sinus rhythm Atrial premature complexes Left atrial enlargement Left ventricular hypertrophy Anterior Q waves, possibly due to LVH Borderline ST elevation, inferior leads peaked T waves new from previous  Assessment/Plan Principal Problem:   CAP (community acquired pneumonia) Active Problems:   Essential hypertension   Hyperlipidemia   Anemia   Mild malnutrition  (HCC)   Elevated troponin   Diarrhea   Influenza A   1. Community-acquired pneumonia. Max temp 99.6 blood pressure at the lower end of normal but stable leukocytosis she's not hypoxic. Chest x-ray new oval densities left upper lung and right lung base. In the emergency department she is provided with Rocephin and doxycycline. Note patient was a hospice patient. Spoke to son and daughter at bedside interested in considering comfort care. -Admit to MedSurg -Gentle IV fluids -Nebulizers -Antibiotics as noted above -Monitor oxygen saturation level -Palliative care consult for goals of care  2. Influenza A. Influenza  panel positive. She was started on Tamiflu in the emergency department -continue tamiflu -supportive therapy  2.elevated troponin. Mild. Pt denies chest pain. ekg as noted above.  -palliative care  3. Hypertension. Fair control in the emergency department. Home meds include Lasix -Hold Lasix for now -monitor  4.diarrhea.  -gi pathogen panel  5. malnutritionfailure to thrive. Weight 102 -nutritional consult -Palliative care consult    DVT prophylaxis: lovenox Code Status: dnr  Family Communication: son and daughter at baseline  Disposition Plan: back to facility  Consults called: palliative care  Admission status: obs    Radene Gunning MD [facility]  If 7PM-7AM, please contact night-coverage [email or web address] Password [facility]  12/17/2016, 3:51 PM

## 2016-12-17 NOTE — Progress Notes (Signed)
[name] is a 83 y.o. female patient admitted from ED awake, alert - oriented  X1 self - no acute distress noted.  VSS - Blood pressure (!) 170/70, pulse 89, temperature 99.6 F (37.6 C), temperature source Rectal, resp. rate 16, height 5\' 3"  (1.6 m), weight 43.3 kg (95 lb 7.4 oz), SpO2 95 %.    IV in place, occlusive dsg intact without redness.  Orientation to room, and floor completed with information packet given to patient/family.  Patient declined safety video at this time.  Admission INP armband ID verified with patient/family, and in place.   SR up x 2, fall assessment complete, with patient and family able to verbalize understanding of risk associated with falls, and verbalized understanding to call nsg before up out of bed.  Call light within reach, patient able to voice, and demonstrate understanding.  Skin, clean-dry- intact with bruising, and skin break down noted on exam on buttocks.      Will cont to eval and treat per MD orders.  [place], RN 12/17/2016 5:45 PM

## 2016-12-17 NOTE — ED Triage Notes (Signed)
Pt arrives from [facility] via [place] reporting cough x 5 days, dx with PNA x 3 days ago, on Levofloxin.  EMS reports no recent fever, chills.  Pt Aox4 at baseline, hx dementia.

## 2016-12-17 NOTE — ED Notes (Signed)
Denise from [facility] called to see if pt would be admitted or discharged, was told she would be updated.   [phone]

## 2016-12-17 NOTE — ED Notes (Signed)
Critical Lab: 0.04 Troponin Dr. Rex Kras made aware

## 2016-12-17 NOTE — ED Notes (Signed)
Got patient undressed inrto a gown on the monitor patient is resting

## 2016-12-17 NOTE — Progress Notes (Signed)
Palliative Medicine RN Note: Spoke with [facility]. Pt was sent to ED without notifying hospice, and [facility] does not have a contract with [facility]. Because pt is being admitted, the family had to revoke based on this. [facility] states that they are willing to re-admit after pt is d/c from hospital. PMT provider will see pt and family as soon as there is a provider available.  Marjie Skiff Woodrow Dulski, RN, BSN, [facility] 12/17/2016 4:08 PM Cell [phone] 8:00-4:00 Monday-Friday Office [phone]

## 2016-12-17 NOTE — ED Provider Notes (Signed)
[facility] DEPT Provider Note   CSN: [id number] Arrival date & time: 12/17/16  1010  History   Chief Complaint Chief Complaint  Patient presents with  . Cough    HPI [name] is a 83 y.o. female.  HPI  Level 5 caveat, the patient is disoriented with diagnosis of alzheimer dementia.  PMH dementia, HTN, HLD, COPD and vitamin D deficiency. Presents from [facility] with cough which began Monday. The patient is disoriented at baseline so this history was taken from her daughter and son who are in the room. Yesterday she was evaluated and started on levaquin for suspected pneumonia, she took one dose of this yesterday evening. They noted that she has had a decreased appetite and been drinking less fluids since Friday. Her family also notes that she has also had loose diarrhea for days.  This morning the long term care facility called her family and mentioned that she was still coughing and had a decreased appetite. She was transported to the ED via EMS.    Past Medical History:  Diagnosis Date  . Anxiety   . Asthma   . Breast cancer ([place])   . COPD (chronic obstructive pulmonary disease) ([place])   . Dementia   . Depression   . Gastroesophageal reflux disease with hiatal hernia   . High cholesterol   . Hypertension   . Need for pneumococcal vaccine    had vaccine several times  . Osteoporosis   . Pneumonia    multiple  . Prediabetes   . Vitamin D deficiency     Patient Active Problem List   Diagnosis Date Noted  . Encounter for Medicare annual wellness exam 05/07/2016  . Mild malnutrition ([place]) 05/07/2016  . Osteopenia 04/05/2015  . Atherosclerosis of aorta ([place]) 04/05/2015  . Anemia 04/05/2015  . SDAT 01/30/2014  . Medication management 12/05/2013  . Hyperlipidemia   . Prediabetes   . Vitamin D deficiency   . Essential hypertension 11/29/2010  . EMPHYSEMA 11/29/2010  . Asthma 11/29/2010    Past Surgical History:  Procedure Laterality Date  .  APPENDECTOMY    . lumpectomy and radiation     for breast cancer  . REPAIR ZENKER'S DIVERTICULA    . THROAT SURGERY      OB History    No data available       Home Medications    Prior to Admission medications   Medication Sig Start Date End Date Taking? Authorizing Provider  furosemide (LASIX) 20 MG tablet TAKE 1 TABLET (20 MG TOTAL) BY MOUTH DAILY. Patient not taking: Reported on 09/05/2016 08/23/16   Unk Pinto, MD  LORazepam (ATIVAN) 0.5 MG tablet Take 1 tablet every 4 hours PRN for agitation. 09/10/16   Unk Pinto, MD    Family History Family History  Problem Relation Age of Onset  . Heart attack Brother     had in his 60s    Social History Social History  Substance Use Topics  . Smoking status: Former Smoker    Packs/day: 1.00    Years: 20.00    Types: Cigarettes    Quit date: 09/08/1998  . Smokeless tobacco: Never Used     Comment: quit 1988  . Alcohol use No     Allergies   Codeine; Desyrel [trazodone]; Penicillins; Sulfa antibiotics; Theophyllines; and Ventolin [albuterol]   Review of Systems Review of Systems  Constitutional: Positive for appetite change.  Respiratory: Positive for cough.   Gastrointestinal: Positive for diarrhea.   Level 5  caveat, the patient is disoriented with diagnosis of alzheimer dementia.   Physical Exam Updated Vital Signs BP (!) 159/74   Pulse 91   Temp 99.6 F (37.6 C) (Rectal)   Resp 16   Wt 46.3 kg   SpO2 93%   BMI 18.07 kg/m   Physical Exam  Constitutional: She appears well-developed. No distress.  Ill appearing older woman   HENT:  Head: Normocephalic and atraumatic.  Neck: Normal range of motion.  Cardiovascular: Normal rate and regular rhythm.   No murmur heard. No peripheral edema   Pulmonary/Chest: Effort normal. No respiratory distress.  Diffuse course crackles   Abdominal: Soft. Bowel sounds are normal. She exhibits no distension. There is no guarding.  Neurological: She is alert.    Alert, not oriented, not able to follow commands   Skin: Skin is warm and dry. Capillary refill takes less than 2 seconds. She is not diaphoretic.     ED Treatments / Results  Labs (all labs ordered are listed, but only abnormal results are displayed) Labs Reviewed  INFLUENZA PANEL BY PCR (TYPE A & B)  URINALYSIS, ROUTINE W REFLEX MICROSCOPIC  CBC  LACTIC ACID, PLASMA  LACTIC ACID, PLASMA    EKG  EKG Interpretation None       Radiology No results found.  Procedures Procedures (including critical care time)  Medications Ordered in ED Medications - No data to display   Initial Impression / Assessment and Plan / ED Course  I have reviewed the triage vital signs and the nursing notes.  Pertinent labs & imaging results that were available during my care of the patient were reviewed by me and considered in my medical decision making (see chart for details).  Chest xray shows right lower lobe consolidation concerning for pneumonia. Will start empiric antibiotics 1 dose of IV ceftriaxone and doxycycline. EKG shows spiked T waves. Gave 1L NS bolus for possible AKI leading to hyperkalemia.  1:15 pm Influenza A resulted positive. Given her flu diagnosis, infiltrate, dehydration and decreased oral intake she will be admitted for pneumonia.   Final Clinical Impressions(s) / ED Diagnoses   Final diagnoses:  None    New Prescriptions New Prescriptions   No medications on file     Ledell Noss, MD 12/17/16 [place], MD 12/17/16 [place], MD 12/21/16 [phone]

## 2016-12-18 DIAGNOSIS — B999 Unspecified infectious disease: Secondary | ICD-10-CM | POA: Diagnosis not present

## 2016-12-18 DIAGNOSIS — J101 Influenza due to other identified influenza virus with other respiratory manifestations: Secondary | ICD-10-CM | POA: Diagnosis not present

## 2016-12-18 DIAGNOSIS — Z885 Allergy status to narcotic agent status: Secondary | ICD-10-CM | POA: Diagnosis not present

## 2016-12-18 DIAGNOSIS — J189 Pneumonia, unspecified organism: Secondary | ICD-10-CM | POA: Diagnosis not present

## 2016-12-18 DIAGNOSIS — J440 Chronic obstructive pulmonary disease with acute lower respiratory infection: Secondary | ICD-10-CM | POA: Diagnosis present

## 2016-12-18 DIAGNOSIS — I10 Essential (primary) hypertension: Secondary | ICD-10-CM | POA: Diagnosis not present

## 2016-12-18 DIAGNOSIS — Z8249 Family history of ischemic heart disease and other diseases of the circulatory system: Secondary | ICD-10-CM | POA: Diagnosis not present

## 2016-12-18 DIAGNOSIS — Z853 Personal history of malignant neoplasm of breast: Secondary | ICD-10-CM | POA: Diagnosis not present

## 2016-12-18 DIAGNOSIS — Z515 Encounter for palliative care: Secondary | ICD-10-CM

## 2016-12-18 DIAGNOSIS — R627 Adult failure to thrive: Secondary | ICD-10-CM

## 2016-12-18 DIAGNOSIS — Z882 Allergy status to sulfonamides status: Secondary | ICD-10-CM | POA: Diagnosis not present

## 2016-12-18 DIAGNOSIS — R05 Cough: Secondary | ICD-10-CM | POA: Diagnosis not present

## 2016-12-18 DIAGNOSIS — G309 Alzheimer's disease, unspecified: Secondary | ICD-10-CM | POA: Diagnosis present

## 2016-12-18 DIAGNOSIS — E785 Hyperlipidemia, unspecified: Secondary | ICD-10-CM | POA: Diagnosis present

## 2016-12-18 DIAGNOSIS — Z7189 Other specified counseling: Secondary | ICD-10-CM | POA: Diagnosis not present

## 2016-12-18 DIAGNOSIS — M810 Age-related osteoporosis without current pathological fracture: Secondary | ICD-10-CM | POA: Diagnosis present

## 2016-12-18 DIAGNOSIS — Z66 Do not resuscitate: Secondary | ICD-10-CM | POA: Diagnosis present

## 2016-12-18 DIAGNOSIS — E441 Mild protein-calorie malnutrition: Secondary | ICD-10-CM | POA: Diagnosis present

## 2016-12-18 DIAGNOSIS — E7800 Pure hypercholesterolemia, unspecified: Secondary | ICD-10-CM | POA: Diagnosis present

## 2016-12-18 DIAGNOSIS — Z880 Allergy status to penicillin: Secondary | ICD-10-CM | POA: Diagnosis not present

## 2016-12-18 DIAGNOSIS — Z87891 Personal history of nicotine dependence: Secondary | ICD-10-CM | POA: Diagnosis not present

## 2016-12-18 DIAGNOSIS — Z681 Body mass index (BMI) 19 or less, adult: Secondary | ICD-10-CM | POA: Diagnosis not present

## 2016-12-18 DIAGNOSIS — R748 Abnormal levels of other serum enzymes: Secondary | ICD-10-CM | POA: Diagnosis not present

## 2016-12-18 DIAGNOSIS — Z23 Encounter for immunization: Secondary | ICD-10-CM | POA: Diagnosis not present

## 2016-12-18 DIAGNOSIS — D649 Anemia, unspecified: Secondary | ICD-10-CM | POA: Diagnosis present

## 2016-12-18 DIAGNOSIS — K449 Diaphragmatic hernia without obstruction or gangrene: Secondary | ICD-10-CM | POA: Diagnosis present

## 2016-12-18 DIAGNOSIS — I248 Other forms of acute ischemic heart disease: Secondary | ICD-10-CM | POA: Diagnosis present

## 2016-12-18 DIAGNOSIS — F0280 Dementia in other diseases classified elsewhere without behavioral disturbance: Secondary | ICD-10-CM | POA: Diagnosis present

## 2016-12-18 DIAGNOSIS — R402441 Other coma, without documented Glasgow coma scale score, or with partial score reported, in the field [EMT or ambulance]: Secondary | ICD-10-CM | POA: Diagnosis not present

## 2016-12-18 DIAGNOSIS — R7303 Prediabetes: Secondary | ICD-10-CM | POA: Diagnosis present

## 2016-12-18 DIAGNOSIS — K219 Gastro-esophageal reflux disease without esophagitis: Secondary | ICD-10-CM | POA: Diagnosis present

## 2016-12-18 DIAGNOSIS — J1000 Influenza due to other identified influenza virus with unspecified type of pneumonia: Secondary | ICD-10-CM | POA: Diagnosis present

## 2016-12-18 DIAGNOSIS — Z888 Allergy status to other drugs, medicaments and biological substances status: Secondary | ICD-10-CM | POA: Diagnosis not present

## 2016-12-18 LAB — CBC
HCT: 34.7 % — ABNORMAL LOW (ref 36.0–46.0)
HEMOGLOBIN: 10.8 g/dL — AB (ref 12.0–15.0)
MCH: 29.5 pg (ref 26.0–34.0)
MCHC: 31.1 g/dL (ref 30.0–36.0)
MCV: 94.8 fL (ref 78.0–100.0)
PLATELETS: 163 10*3/uL (ref 150–400)
RBC: 3.66 MIL/uL — AB (ref 3.87–5.11)
RDW: 14.1 % (ref 11.5–15.5)
WBC: 4.6 10*3/uL (ref 4.0–10.5)

## 2016-12-18 LAB — BASIC METABOLIC PANEL
ANION GAP: 9 (ref 5–15)
BUN: 14 mg/dL (ref 6–20)
CO2: 27 mmol/L (ref 22–32)
CREATININE: 0.69 mg/dL (ref 0.44–1.00)
Calcium: 8.1 mg/dL — ABNORMAL LOW (ref 8.9–10.3)
Chloride: 105 mmol/L (ref 101–111)
GFR calc non Af Amer: >60 mL/min (ref >60)
Glucose, Bld: 82 mg/dL (ref 65–99)
Potassium: 3.6 mmol/L (ref 3.5–5.1)
SODIUM: 141 mmol/L (ref 135–145)

## 2016-12-18 MED ORDER — ALBUTEROL SULFATE (2.5 MG/3ML) 0.083% IN NEBU
2.5000 mg | INHALATION_SOLUTION | Freq: Three times a day (TID) | RESPIRATORY_TRACT | Status: DC
  Administered 2016-12-18 – 2016-12-19 (×3): 2.5 mg via RESPIRATORY_TRACT
  Filled 2016-12-18 (×4): qty 3

## 2016-12-18 MED ORDER — LORAZEPAM 2 MG/ML IJ SOLN
0.2500 mg | Freq: Once | INTRAMUSCULAR | Status: AC
  Administered 2016-12-19: 0.25 mg via INTRAVENOUS
  Filled 2016-12-18: qty 1

## 2016-12-18 MED ORDER — ALBUTEROL SULFATE (2.5 MG/3ML) 0.083% IN NEBU: 2.5000 mg | INHALATION_SOLUTION | RESPIRATORY_TRACT | Status: DC | PRN

## 2016-12-18 NOTE — Progress Notes (Signed)
Nutrition Brief Note  Chart reviewed. Pt is followed by hospice; plan to transition to hospice services once medically optimized and discharged.  Palliative Care team following; main goal is comfort. No further nutrition interventions warranted at this time.  Please re-consult as needed.   Mahogani Holohan A. Jimmye Norman, RD, LDN, CDE Pager: [phone] After hours Pager: [phone]

## 2016-12-18 NOTE — Progress Notes (Signed)
Pt. Had a large formed bowel movement c-diff precautions d/ced.

## 2016-12-18 NOTE — Care Management Obs Status (Signed)
[place] NOTIFICATION   Patient Details  Name: SHRON OZER MRN: [id number] Date of Birth: [birth year]   Medicare Observation Status Notification Given:  Yes    Sharin Mons, RN 12/18/2016, 1:42 PM

## 2016-12-18 NOTE — NC FL2 (Signed)
[place] LEVEL OF CARE SCREENING TOOL     IDENTIFICATION  Patient Name: [name] Birthdate: 1925/02/18 Sex: female Admission Date (Current Location): 12/17/2016  [facility] and [place] Number:  Herbalist and Address:  [facility], [place] [address] [place], [place] [address]      Provider Number: [id number]  Attending Physician Name and Address:  Geradine Girt, DO  Relative Name and Phone Number:  Remo Lipps, son, [phone]    Current Level of Care: Hospital Recommended Level of Care: [place] Prior Approval Number:    Date Approved/Denied:   [name] Number:    Discharge Plan: SNF    Current Diagnoses: Patient Active Problem List   Diagnosis Date Noted  . HCAP (healthcare-associated pneumonia) 12/17/2016  . Elevated troponin 12/17/2016  . CAP (community acquired pneumonia) 12/17/2016  . Diarrhea 12/17/2016  . Influenza A 12/17/2016  . Encounter for Medicare annual wellness exam 05/07/2016  . Mild malnutrition ([place]) 05/07/2016  . Osteopenia 04/05/2015  . Atherosclerosis of aorta ([place]) 04/05/2015  . Anemia 04/05/2015  . SDAT 01/30/2014  . Medication management 12/05/2013  . Hyperlipidemia   . Prediabetes   . Vitamin D deficiency   . Essential hypertension 11/29/2010  . EMPHYSEMA 11/29/2010  . Asthma 11/29/2010    Orientation RESPIRATION BLADDER Height & Weight     Self  Normal Continent Weight: 43.3 kg (95 lb 7.4 oz) Height:  5\' 3"  (160 cm)  BEHAVIORAL SYMPTOMS/MOOD NEUROLOGICAL BOWEL NUTRITION STATUS      Continent Diet  AMBULATORY STATUS COMMUNICATION OF NEEDS Skin   Limited Assist Verbally Normal                       Personal Care Assistance Level of Assistance  Bathing, Feeding, Dressing Bathing Assistance: Limited assistance Feeding assistance: Limited assistance Dressing Assistance: Limited assistance     Functional Limitations Info             SPECIAL CARE  FACTORS FREQUENCY                       Contractures      Additional Factors Info  Code Status, Allergies, Isolation Precautions Code Status Info: DNR Allergies Info:  Codeine, Sulfa Antibiotics, Ventolin Albuterol, Desyrel Trazodone, Penicillins, Theophyllines     Isolation Precautions Info: Droplet precautions     Current Medications (12/18/2016):  This is the current hospital active medication list Current Facility-Administered Medications  Medication Dose Route Frequency Provider Last Rate Last Dose  . acetaminophen (TYLENOL) tablet 650 mg  650 mg Oral Q6H PRN Radene Gunning, NP       Or  . acetaminophen (TYLENOL) suppository 650 mg  650 mg Rectal Q6H PRN Radene Gunning, NP      . albuterol (PROVENTIL) (2.5 MG/3ML) 0.083% nebulizer solution 2.5 mg  2.5 mg Nebulization TID Geradine Girt, DO      . albuterol (PROVENTIL) (2.5 MG/3ML) 0.083% nebulizer solution 2.5 mg  2.5 mg Nebulization Q4H PRN Geradine Girt, DO      . cefTRIAXone (ROCEPHIN) 1 g in dextrose 5 % 50 mL IVPB  1 g Intravenous Q24H Radene Gunning, NP   1 g at 12/18/16 1245  . doxycycline (VIBRA-TABS) tablet 100 mg  100 mg Oral Q12H Radene Gunning, NP   100 mg at 12/18/16 0945  . enoxaparin (LOVENOX) injection 40 mg  40 mg Subcutaneous Q24H Lezlie Octave  Black, NP   40 mg at 12/18/16 0944  . feeding supplement (ENSURE ENLIVE) (ENSURE ENLIVE) liquid 237 mL  237 mL Oral 2 times per day Waldemar Dickens, MD   237 mL at 12/18/16 0800  . ondansetron (ZOFRAN) tablet 4 mg  4 mg Oral Q6H PRN Radene Gunning, NP       Or  . ondansetron [facility]) injection 4 mg  4 mg Intravenous Q6H PRN Radene Gunning, NP      . oseltamivir (TAMIFLU) capsule 30 mg  30 mg Oral Daily Radene Gunning, NP   30 mg at 12/18/16 6073     Discharge Medications: Please see discharge summary for a list of discharge medications.  Relevant Imaging Results:  Relevant Lab Results:   Additional Information SSN: [id number]  Will come with [place]

## 2016-12-18 NOTE — Progress Notes (Signed)
PROGRESS NOTE    [name]  OJJ:[id number] DOB: Aug 28, 1925 DOA: 12/17/2016 PCP: Alesia Richards, MD   Outpatient Specialists:     Brief Narrative:   [name] is a 83 y.o. female who presents with marked respiratory distress from Influenza and possible bacterial superinfection and HCAP. Will not treat aggressively w/ Vanc and Cefepime but will start conservatively w/ CTX and doxy. Of note pt was previously on Hospice though it seems the family was unfamiliar with what that designation truly means. Palliative care has been consulted to assist w/ [place] discussions. Family is very amenable to this. Elevated troponin noted and likely from underlying age related CAD and demand from infection. Pt is not a candidate for emergent treatment should she develop true MI, so will not trend at this time. Suspect diarrhea is likely stress related due to respiratory infection and flu.    Assessment & Plan:   Principal Problem:   CAP (community acquired pneumonia) Active Problems:   Essential hypertension   Hyperlipidemia   Anemia   Mild malnutrition (HCC)   Elevated troponin   Diarrhea   Influenza A   Influenza A -tamiflu- renally adjusted  CAP -doxy/rocephin started in ER  Failure to thrive -patient's condition has been deteriorating since december when hospice started  elevated troponin -from infection, will not trend  Severe dementia  Long discussion with daughter surrounding goals of care and prognosis of her mother.  It appears she wants treatable conditions treated but if patient does not respond, would pursue residential hospice.  Patient is currently at an ALF and to be able to return, must walk.     DVT prophylaxis:  Lovenox   Code Status: DNR   Family Communication: Daughter at bedside  Disposition Plan:     Consultants:   Palliative care    Subjective: Patient not able to communicate- occasionally mumbles a few words -given low  dose ativan last PM for "fidgeting"   Objective: Vitals:   12/17/16 2201 12/18/16 0521 12/18/16 0547 12/18/16 0932  BP: (!) 156/60 (!) 163/50 (!) 137/55   Pulse: 83 (!) 136 71   Resp: 18 18 18    Temp:  98.3 F (36.8 C)    TempSrc:      SpO2: 97% (!) 89% 90% 92%  Weight:      Height:        Intake/Output Summary (Last 24 hours) at 12/18/16 0942 Last data filed at 12/18/16 0626  Gross per 24 hour  Intake          1753.33 ml  Output                0 ml  Net          1753.33 ml   Filed Weights   12/17/16 1018 12/17/16 1703  Weight: 46.3 kg (102 lb) 43.3 kg (95 lb 7.4 oz)    Examination:  General exam: chronically ill  Respiratory system: Coarse breath sounds Cardiovascular system: S1 & S2 heard, RRR. No JVD, murmurs, rubs, gallops or clicks. No pedal edema. Gastrointestinal system: Abdomen is nondistended, soft and nontender. No organomegaly or masses felt. Normal bowel sounds heard. Central nervous system: unable to cooperate with neuro exam as eye closed and not responsive      Data Reviewed: I have personally reviewed following labs and imaging studies  CBC:  Recent Labs Lab 12/17/16 1049 12/18/16 0341  WBC 3.4* 4.6  HGB 12.5 10.8*  HCT 37.9 34.7*  MCV 97.7 94.8  PLT 199 818   Basic Metabolic Panel:  Recent Labs Lab 12/17/16 1049 12/18/16 0341  NA 139 141  K 4.1 3.6  CL 101 105  CO2 26 27  GLUCOSE 95 82  BUN 25* 14  CREATININE 0.97 0.69  CALCIUM 9.3 8.1*   GFR: Estimated Creatinine Clearance: 31.9 mL/min (by C-G formula based on SCr of 0.69 mg/dL). Liver Function Tests: No results for input(s): AST, ALT, ALKPHOS, BILITOT, PROT, ALBUMIN in the last 168 hours. No results for input(s): LIPASE, AMYLASE in the last 168 hours. No results for input(s): AMMONIA in the last 168 hours. Coagulation Profile: No results for input(s): INR, PROTIME in the last 168 hours. Cardiac Enzymes:  Recent Labs Lab 12/17/16 1049  TROPONINI 0.04*   BNP (last 3  results) No results for input(s): PROBNP in the last 8760 hours. HbA1C: No results for input(s): HGBA1C in the last 72 hours. CBG: No results for input(s): GLUCAP in the last 168 hours. Lipid Profile: No results for input(s): CHOL, HDL, LDLCALC, TRIG, CHOLHDL, LDLDIRECT in the last 72 hours. Thyroid Function Tests: No results for input(s): TSH, T4TOTAL, FREET4, T3FREE, THYROIDAB in the last 72 hours. Anemia Panel: No results for input(s): VITAMINB12, FOLATE, FERRITIN, TIBC, IRON, RETICCTPCT in the last 72 hours. Urine analysis:    Component Value Date/Time   COLORURINE YELLOW 12/17/2016 1528   APPEARANCEUR CLEAR 12/17/2016 1528   LABSPEC 1.015 12/17/2016 1528   PHURINE 6.0 12/17/2016 1528   GLUCOSEU NEGATIVE 12/17/2016 1528   HGBUR NEGATIVE 12/17/2016 1528   BILIRUBINUR NEGATIVE 12/17/2016 1528   KETONESUR 5 (A) 12/17/2016 1528   PROTEINUR NEGATIVE 12/17/2016 1528   UROBILINOGEN 0.2 04/05/2015 1548   NITRITE NEGATIVE 12/17/2016 1528   LEUKOCYTESUR MODERATE (A) 12/17/2016 1528     ) Recent Results (from the past 240 hour(s))  MRSA PCR Screening     Status: None   Collection Time: 12/17/16  5:43 PM  Result Value Ref Range Status   MRSA by PCR NEGATIVE NEGATIVE Final    Comment:        The GeneXpert MRSA Assay (FDA approved for NASAL specimens only), is one component of a comprehensive MRSA colonization surveillance program. It is not intended to diagnose MRSA infection nor to guide or monitor treatment for MRSA infections.       Anti-infectives    Start     Dose/Rate Route Frequency Ordered Stop   12/18/16 1200  cefTRIAXone (ROCEPHIN) 1 g in dextrose 5 % 50 mL IVPB     1 g 100 mL/hr over 30 Minutes Intravenous Every 24 hours 12/17/16 1446 12/25/16 1159   12/17/16 2200  doxycycline (VIBRA-TABS) tablet 100 mg     100 mg Oral Every 12 hours 12/17/16 1446 12/24/16 2159   12/17/16 1930  oseltamivir (TAMIFLU) capsule 30 mg     30 mg Oral Daily 12/17/16 1925 12/22/16  0959   12/17/16 1315  oseltamivir (TAMIFLU) capsule 75 mg  Status:  Discontinued     75 mg Oral  Once 12/17/16 1314 12/17/16 1925   12/17/16 1215  cefTRIAXone (ROCEPHIN) 1 g in dextrose 5 % 50 mL IVPB     1 g 100 mL/hr over 30 Minutes Intravenous  Once 12/17/16 1212 12/17/16 1304   12/17/16 1215  doxycycline (VIBRA-TABS) tablet 100 mg     100 mg Oral  Once 12/17/16 1212 12/17/16 1239       Radiology Studies: Dg Chest 2 View  Result Date: 12/17/2016 CLINICAL DATA:  Cough, congestion and  short of breath for 3 days. EXAM: CHEST  2 VIEW COMPARISON:  09/05/2006 FINDINGS: The heart remains mildly enlarged. Lungs are hyperaerated. Calcified densities at the right lung base are stable. Moderate hiatal hernia is stable. Oval density projects over the left upper lung zone. Considerations include external object or pulmonary nodule. There is a similar density at the right lung base these measure approximately 2 cm in size. No pneumothorax. No pleural effusion. Osteopenia. Stable spine. IMPRESSION: There are new oval densities in the left upper lung zone and right lung base which may be external objects. Pulmonary nodule cannot be excluded. Electronically Signed   By: Marybelle Killings M.D.   On: 12/17/2016 11:13        Scheduled Meds: . albuterol  2.5 mg Nebulization Q4H  . cefTRIAXone (ROCEPHIN)  IV  1 g Intravenous Q24H  . doxycycline  100 mg Oral Q12H  . enoxaparin (LOVENOX) injection  40 mg Subcutaneous Q24H  . feeding supplement (ENSURE ENLIVE)  237 mL Oral 2 times per day  . Influenza vac split quadrivalent PF  0.5 mL Intramuscular Tomorrow-1000  . oseltamivir  30 mg Oral Daily   Continuous Infusions:   LOS: 0 days    Time spent: 25 min    [place], DO [facility] Pager [phone]  If 7PM-7AM, please contact night-coverage [email or web address] Password [facility] 12/18/2016, 9:42 AM

## 2016-12-18 NOTE — Consult Note (Signed)
Consultation Note Date: 12/18/2016   Patient Name: [name]  DOB: [birth year]  MRN: [id number]  Age / Sex: 83 y.o., female  PCP: Unk Pinto, MD Referring Physician: Geradine Girt, DO  Reason for Consultation: Disposition, Establishing goals of care and Psychosocial/spiritual support  HPI/Patient Profile: 83 y.o. female  with past medical history of dementia, HLD, CAD, COPD (no baseline O2), and osteopenia. She presented from her memory care unit after progressive respiratory symptoms unrelieved by starting PO antibiotics. Of note, she has been on Hospice care (through [facility] and Hospice), however there was confusion surrounding if Hospice has been contacted to help manage her symptoms prior to transfer to the ED. She was admitted under observation status on 12/17/2016 with Influenza A and CAP. She has improved with antibiotics. Palliative consulted to help clarify goals of care given hospital presentation while under Hospice care.   Clinical Assessment and Goals of Care: Yeily is unable to meaningfully participate in a goals of care conversation due to dementia. Her son and daughter were at her bedside. They had just spoken with Elita Boone, a liaison for [facility] and Hospice.  Remo Lipps and Lattie Haw were very clear that their goals for their mother were focused around comfort. To ensure comfort, they do want to treat the treatable in the event she is otherwise awake and interactive.They viewed her escalating respiratory symptoms as something that could be treated and improved, and felt that there was no alternative to ED presentation given she was already placed on oral antibiotics at her facility. They had not realized that Hospice had not been involved when they were called about her symptoms. We talked at length about the process of calling Hospice for management of her symptoms, and goal of treating her in place. They  agreed with trying to prevent rehospitalization by utilizing Hospice support.   At present, they do want to continue to treat her flu and pneumonia as she is improving and now closer to her baseline. Given her weakness and increased care needs, they do not feel she can return to her memory care unit. SW will need to work with them to identify alternative discharge plan. Regardless of where she goes, they would like to resume [facility] with [facility].   Primary Decision Maker NEXT OF KIN   SUMMARY OF RECOMMENDATIONS    DNR, continue to treat what is treatable, plan to d/c with Hospice once medically optimized  SW consulted to assist in discharge planning  Asked for hospice liaison be updated on discharge location  **Palliative will sign off at this point, given clear goals of care and controlled symptoms. Please re-consult team for further needs, or contact for questions: [phone].  Code Status/Advance Care Planning:  DNR  Additional Recommendations (Limitations, Scope, Preferences):  Treat what is treatable, then resume hospice on discharge.  Psycho-social/Spiritual:   Desire for further Chaplaincy support:no  Additional Recommendations: Education on Hospice  Prognosis:   < 6 months  Discharge Planning: [place] with Hospice      Primary Diagnoses: Present on Admission: . Anemia . Essential hypertension . Hyperlipidemia . Elevated troponin . CAP (community acquired pneumonia) . Diarrhea . Influenza A . Mild malnutrition ([place])   I have reviewed the medical record, interviewed the patient and family, and examined the patient. The following aspects are pertinent.  Past Medical History:  Diagnosis Date  . Anxiety   . Asthma   . Breast cancer ([place])   . COPD (chronic obstructive pulmonary  disease) ([place])   . Dementia   . Depression   . Gastroesophageal reflux disease with hiatal hernia   . High cholesterol   . Hypertension   . Need for  pneumococcal vaccine    had vaccine several times  . Osteoporosis   . Pneumonia    multiple  . Prediabetes   . Vitamin D deficiency    Social History   Social History  . Marital status: Widowed    Spouse name: N/A  . Number of children: N/A  . Years of education: N/A   Social History Main Topics  . Smoking status: Former Smoker    Packs/day: 1.00    Years: 20.00    Types: Cigarettes    Quit date: 09/08/1998  . Smokeless tobacco: Never Used     Comment: quit 1988  . Alcohol use No  . Drug use: No  . Sexual activity: Not Asked   Other Topics Concern  . None   Social History Narrative   She lives with her son.    Family History  Problem Relation Age of Onset  . Heart attack Brother     had in his 67s   Scheduled Meds: . albuterol  2.5 mg Nebulization TID  . cefTRIAXone (ROCEPHIN)  IV  1 g Intravenous Q24H  . doxycycline  100 mg Oral Q12H  . enoxaparin (LOVENOX) injection  40 mg Subcutaneous Q24H  . feeding supplement (ENSURE ENLIVE)  237 mL Oral 2 times per day  . oseltamivir  30 mg Oral Daily   Continuous Infusions: PRN Meds:.acetaminophen **OR** acetaminophen, albuterol, ondansetron **OR** ondansetron (ZOFRAN) IV Allergies  Allergen Reactions  . Codeine Nausea And Vomiting  . Sulfa Antibiotics Nausea And Vomiting  . Ventolin [Albuterol] Other (See Comments)    tremor  . Desyrel [Trazodone] Other (See Comments)  . Penicillins Other (See Comments)    "childhood"  . Theophyllines Other (See Comments)   Review of Systems  Constitutional: Positive for activity change, appetite change and fatigue.  HENT: Negative for congestion, hearing loss, sore throat and trouble swallowing.   Eyes: Negative for visual disturbance.  Respiratory: Positive for cough. Negative for chest tightness and shortness of breath.   Cardiovascular: Positive for chest pain (when coughing).  Gastrointestinal: Positive for diarrhea (none since admission). Negative for abdominal pain and  constipation.  Genitourinary: Negative for flank pain.  Musculoskeletal: Positive for gait problem. Negative for back pain.  Skin: Positive for pallor.  Neurological: Positive for weakness. Negative for dizziness and speech difficulty.  Hematological: Bruises/bleeds easily.  Psychiatric/Behavioral: Positive for agitation. Negative for sleep disturbance. The patient is nervous/anxious.     Physical Exam  Constitutional: She has a sickly appearance.  Frail woman lying in bed  HENT:  Head: Normocephalic and atraumatic.  Mouth/Throat: Oropharynx is clear and moist. No oropharyngeal exudate.  Eyes: EOM are normal.  Neck: Normal range of motion.  Cardiovascular: Normal rate.   Pulmonary/Chest: No respiratory distress.  Intermittent wet hacking cough  Musculoskeletal: Normal range of motion. She exhibits no edema.  Neurological: She is alert.  Oriented to person only.   Skin: Skin is warm and dry. Bruising noted. There is pallor.  Psychiatric: She has a normal mood and affect. Thought content normal. Her speech is delayed. She is slowed. Cognition and memory are impaired. She expresses impulsivity and inappropriate judgment.    Vital Signs: BP (!) 137/55 (BP Location: Left Arm)   Pulse 71   Temp 98.3 F (36.8 C)   Resp 18  Ht 5\' 3"  (1.6 m)   Wt 43.3 kg (95 lb 7.4 oz)   SpO2 92%   BMI 16.91 kg/m  Pain Assessment: Faces     SpO2: SpO2: 92 % O2 Device:SpO2: 92 % O2 Flow Rate: .O2 Flow Rate (L/min): 2 L/min  IO: Intake/output summary:  Intake/Output Summary (Last 24 hours) at 12/18/16 1404 Last data filed at 12/18/16 0626  Gross per 24 hour  Intake          1703.33 ml  Output                0 ml  Net          1703.33 ml    LBM: Last BM Date: 12/16/16 Baseline Weight: Weight: 46.3 kg (102 lb) Most recent weight: Weight: 43.3 kg (95 lb 7.4 oz)     Palliative Assessment/Data: PPS 40-50%   Time Total: 50 minutes Greater than 50%  of this time was spent counseling and  coordinating care related to the above assessment and plan.  Signed by: Charlynn Court, NP Palliative Medicine Team Pager # [phone] (M-F 7a-5p) Team Phone # [phone] (Nights/Weekends)

## 2016-12-18 NOTE — Clinical Social Work Note (Signed)
Clinical Social Work Assessment  Patient Details  Name: [name] MRN: [id number] Date of Birth: 1925/05/06  Date of referral:  12/18/16               Reason for consult:  Discharge Planning                Permission sought to share information with:  Facility Sport and exercise psychologist, Family Supports Permission granted to share information::  No (Disoriented)  Name::     [name]  Agency::  [facility]  Relationship::  Son  Contact Information:  [phone]  Housing/Transportation Living arrangements for the past 2 months:  [place] of Information:  Adult Children Patient Interpreter Needed:  None Criminal Activity/Legal Involvement Pertinent to Current Situation/Hospitalization:  No - Comment as needed Significant Relationships:  Adult Children Lives with:  Facility Resident Do you feel safe going back to the place where you live?  Yes Need for family participation in patient care:  Yes (Comment)  Care giving concerns:  CSW received regarding discharge planning. Patient is disoriented. CSW spoke with patient's son,[name] and daughter, [name]. Patient resides at [place]. [facility] at facility, but family revoked Hospice when they came to [facility] (not in network). They are aware that they can add Hospice back on once discharged. Patient is unable to return to ALF due to mobility issues and will require SNF at discharge. CSW to continue to follow and assist with discharge planning needs.   Social Worker assessment / plan:  CSW spoke with patient's family concerning SNF at discharge.   Employment status:  Retired Forensic scientist:  Medicare PT Recommendations:  Not assessed at this time [place] / Referral to community resources:  Other (Comment Required) (N/A)  Patient/Family's Response to care:  Patient's family expresses agreement with discharge plan and has a preference for [place], where  Lisa's family member recently passed away.   Patient/Family's Understanding of and Emotional Response to Diagnosis, Current Treatment, and Prognosis:  Patient's family reported that they were confused by Hospice rules (such as [facility] being out of network with [facility]). Patient's family expressed understanding of CSW role and discharge process. No questions/concerns about plan or treatment.    Emotional Assessment Appearance:  Appears stated age Attitude/Demeanor/Rapport:  Unable to Assess Affect (typically observed):  Unable to Assess Orientation:  Oriented to Self Alcohol / Substance use:  Not Applicable Psych involvement (Current and /or in the community):  No (Comment)  Discharge Needs  Concerns to be addressed:  Care Coordination Readmission within the last 30 days:  No Current discharge risk:  None Barriers to Discharge:  Continued Medical Work up   [employer], [place] 12/18/2016, 10:26 AM

## 2016-12-19 LAB — PROCALCITONIN: Procalcitonin: <0.1 ng/mL

## 2016-12-19 MED ORDER — ALBUTEROL SULFATE (2.5 MG/3ML) 0.083% IN NEBU: 2.5000 mg | INHALATION_SOLUTION | Freq: Four times a day (QID) | RESPIRATORY_TRACT | Status: DC | PRN

## 2016-12-19 MED ORDER — LORAZEPAM 2 MG/ML IJ SOLN
0.2500 mg | Freq: Once | INTRAMUSCULAR | Status: AC
  Administered 2016-12-19: 0.25 mg via INTRAVENOUS
  Filled 2016-12-19: qty 1

## 2016-12-19 NOTE — Evaluation (Signed)
Physical Therapy Evaluation Patient Details Name: [name] MRN: [id number] DOB: 11-30-1924 Today's Date: 12/19/2016   History of Present Illness  83 yo female with onset of CAP, malnourised, mild elevated troponin, FluA and FTT was admitted.  Previous resident of memory care unit.    Clinical Impression  Pt was assessed for mobility after recent illnesses and extended bedrest.  Despite her independent gait in memory unit, now has more dependent mobility and will need to get stronger to continue on in that environment.  Will ask for SNF placement to increase her safety and independence to allow transition home.  Follow acutely for same, to progress gait and transfers, increase LE strength and promote better balance sitting and standing.    Follow Up Recommendations SNF    Equipment Recommendations  None recommended by PT    Recommendations for Other Services       Precautions / Restrictions Precautions Precautions: Fall (telemetry) Restrictions Weight Bearing Restrictions: No      Mobility  Bed Mobility Overal bed mobility: Needs Assistance Bed Mobility: Supine to Sit;Sit to Supine     Supine to sit: Mod assist Sit to supine: Mod assist      Transfers Overall transfer level: Needs assistance Equipment used: Rolling walker (2 wheeled);1 person hand held assist Transfers: Sit to/from [facility] Sit to Stand: Mod assist;Min assist Stand pivot transfers: Min assist;Mod assist       General transfer comment: has difficulty with hand placement, cues for all sequence and to power up  Ambulation/Gait Ambulation/Gait assistance: Min assist;Mod assist Ambulation Distance (Feet): 35 Feet Assistive device: Rolling walker (2 wheeled);1 person hand held assist Gait Pattern/deviations: Step-through pattern;Step-to pattern;Wide base of support;Trunk flexed;Shuffle;Decreased stride length Gait velocity: reduced Gait velocity interpretation: Below normal  speed for age/gender General Gait Details: pt was assisted with all turns and leaning backward on PT during entire walk  Stairs            Wheelchair Mobility    Modified Rankin (Stroke Patients Only)       Balance Overall balance assessment: Needs assistance Sitting-balance support: Feet supported;Bilateral upper extremity supported Sitting balance-Leahy Scale: Fair   Postural control: Posterior lean Standing balance support: Bilateral upper extremity supported Standing balance-Leahy Scale: Poor                               Pertinent Vitals/Pain Pain Assessment: No/denies pain    Home Living Family/patient expects to be discharged to:: Skilled nursing facility                      Prior Function Level of Independence: Needs assistance   Gait / Transfers Assistance Needed: used no AD but supervised in memory care  ADL's / Homemaking Assistance Needed: has staff of memory unit to care for her        Hand Dominance        Extremity/Trunk Assessment   Upper Extremity Assessment Upper Extremity Assessment: Overall WFL for tasks assessed    Lower Extremity Assessment Lower Extremity Assessment: Generalized weakness    Cervical / Trunk Assessment Cervical / Trunk Assessment: Kyphotic  Communication   Communication: HOH  Cognition Arousal/Alertness: Awake/alert Behavior During Therapy: Flat affect Overall Cognitive Status: History of cognitive impairments - at baseline  General Comments      Exercises     Assessment/Plan    PT Assessment Patient needs continued PT services  PT Problem List Decreased strength;Decreased range of motion;Decreased activity tolerance;Decreased balance;Decreased mobility;Decreased coordination;Decreased cognition;Decreased knowledge of use of DME;Decreased safety awareness;Cardiopulmonary status limiting activity;Decreased skin integrity        PT Treatment Interventions DME instruction;Gait training;Functional mobility training;Therapeutic activities;Therapeutic exercise;Balance training;Neuromuscular re-education;Patient/family education    PT Goals (Current goals can be found in the Care Plan section)  Acute Rehab PT Goals Patient Stated Goal: to walk a little PT Goal Formulation: With family Time For Goal Achievement: 01/02/17 Potential to Achieve Goals: Good    Frequency Min 3X/week   Barriers to discharge Decreased caregiver support (no guarantee 2 staff will be available to her at all times)      Co-evaluation               End of Session Equipment Utilized During Treatment: Gait belt Activity Tolerance: Patient tolerated treatment well;Patient limited by fatigue Patient left: in bed;with call bell/phone within reach;with bed alarm set;with family/visitor present (no chair for sitting up was readily available) Nurse Communication: Mobility status PT Visit Diagnosis: Other abnormalities of gait and mobility (R26.89);Unsteadiness on feet (R26.81)    Time: 6440-3474 PT Time Calculation (min) (ACUTE ONLY): 31 min   Charges:   PT Evaluation $PT Eval Moderate Complexity: 1 Procedure PT Treatments $Gait Training: 8-22 mins   PT G Codes:   PT G-Codes **NOT FOR INPATIENT CLASS** Functional Assessment Tool Used: AM-PAC 6 Clicks Basic Mobility     Ramond Dial 12/19/2016, 4:40 PM   Mee Hives, PT MS Acute Rehab Dept. Number: [place] and [place]

## 2016-12-19 NOTE — Progress Notes (Signed)
[name]: [id number]  [name] [phone]

## 2016-12-19 NOTE — Progress Notes (Addendum)
PROGRESS NOTE    LAKAYA TOLEN  ZOX:[id number] DOB: 09-15-1925 DOA: 12/17/2016 PCP: Alesia Richards, MD   Outpatient Specialists:     Brief Narrative:   JESSIKA ROTHERY is a 83 y.o. female who presents with marked respiratory distress from Influenza and possible bacterial superinfection and HCAP. Will not treat aggressively w/ Vanc and Cefepime but will start conservatively w/ CTX and doxy. Of note pt was previously on Hospice though it seems the family was unfamiliar with what that designation truly means. Palliative care has been consulted to assist w/ [place] discussions. Family is very amenable to this. Elevated troponin noted and likely from underlying age related CAD and demand from infection. Pt is not a candidate for emergent treatment should she develop true MI, so will not trend at this time. Suspect diarrhea is likely stress related due to respiratory infection and flu.    Assessment & Plan:   Principal Problem:   CAP (community acquired pneumonia) Active Problems:   Essential hypertension   Hyperlipidemia   Anemia   Mild malnutrition (HCC)   Elevated troponin   Diarrhea   Influenza A   Goals of care, counseling/discussion   Palliative care by specialist   Influenza A -tamiflu- renally adjusted x 5 days  CAP -doxy/rocephin started in ER- continue -symptomatic treatment- nebs PRN, mucinex -not eating much at all  Failure to thrive -patient's condition has been deteriorating since december when hospice started -not eating much at all -suspect will not be able to return to memory care-- will need SNF -PT Eval  elevated troponin -from infection, will not trend as not candidate for intervention  Severe dementia  Hospice to resume at d/C to SNF although patient not really improving and may need residential hospice Low bed for fall precautions   DVT prophylaxis:  Lovenox   Code Status: DNR   Family Communication: son at  bedside  Disposition Plan:     Consultants:   Palliative care    Subjective: Family states patient ate very little  Patient opens eyes today but still not able to answer question   Objective: Vitals:   12/18/16 1517 12/18/16 2043 12/18/16 2225 12/19/16 0538  BP:   (!) 152/61 (!) 145/70  Pulse:   81 75  Resp:    16  Temp:   98 F (36.7 C) 97.7 F (36.5 C)  TempSrc:   Oral Oral  SpO2: 90% 93% 98% 97%  Weight:      Height:        Intake/Output Summary (Last 24 hours) at 12/19/16 0901 Last data filed at 12/19/16 0500  Gross per 24 hour  Intake              120 ml  Output                0 ml  Net              120 ml   Filed Weights   12/17/16 1018 12/17/16 1703  Weight: 46.3 kg (102 lb) 43.3 kg (95 lb 7.4 oz)    Examination:  General exam: chronically ill appearing, thin Respiratory system: Coarse breath sounds- no wheezing Cardiovascular system: S1 & S2 heard, RRR. No JVD, murmurs, rubs, gallops or clicks. No pedal edema. Gastrointestinal system: Abdomen is nondistended, soft and nontender. No organomegaly or masses felt. Normal bowel sounds heard. Central nervous system: unable to cooperate with neuro exam- does open eyes today and mumble      Data Reviewed:  I have personally reviewed following labs and imaging studies  CBC:  Recent Labs Lab 12/17/16 1049 12/18/16 0341  WBC 3.4* 4.6  HGB 12.5 10.8*  HCT 37.9 34.7*  MCV 97.7 94.8  PLT 199 562   Basic Metabolic Panel:  Recent Labs Lab 12/17/16 1049 12/18/16 0341  NA 139 141  K 4.1 3.6  CL 101 105  CO2 26 27  GLUCOSE 95 82  BUN 25* 14  CREATININE 0.97 0.69  CALCIUM 9.3 8.1*   GFR: Estimated Creatinine Clearance: 31.9 mL/min (by C-G formula based on SCr of 0.69 mg/dL). Liver Function Tests: No results for input(s): AST, ALT, ALKPHOS, BILITOT, PROT, ALBUMIN in the last 168 hours. No results for input(s): LIPASE, AMYLASE in the last 168 hours. No results for input(s): AMMONIA in the last  168 hours. Coagulation Profile: No results for input(s): INR, PROTIME in the last 168 hours. Cardiac Enzymes:  Recent Labs Lab 12/17/16 1049  TROPONINI 0.04*   BNP (last 3 results) No results for input(s): PROBNP in the last 8760 hours. HbA1C: No results for input(s): HGBA1C in the last 72 hours. CBG: No results for input(s): GLUCAP in the last 168 hours. Lipid Profile: No results for input(s): CHOL, HDL, LDLCALC, TRIG, CHOLHDL, LDLDIRECT in the last 72 hours. Thyroid Function Tests: No results for input(s): TSH, T4TOTAL, FREET4, T3FREE, THYROIDAB in the last 72 hours. Anemia Panel: No results for input(s): VITAMINB12, FOLATE, FERRITIN, TIBC, IRON, RETICCTPCT in the last 72 hours. Urine analysis:    Component Value Date/Time   COLORURINE YELLOW 12/17/2016 1528   APPEARANCEUR CLEAR 12/17/2016 1528   LABSPEC 1.015 12/17/2016 1528   PHURINE 6.0 12/17/2016 1528   GLUCOSEU NEGATIVE 12/17/2016 1528   HGBUR NEGATIVE 12/17/2016 1528   BILIRUBINUR NEGATIVE 12/17/2016 1528   KETONESUR 5 (A) 12/17/2016 1528   PROTEINUR NEGATIVE 12/17/2016 1528   UROBILINOGEN 0.2 04/05/2015 1548   NITRITE NEGATIVE 12/17/2016 1528   LEUKOCYTESUR MODERATE (A) 12/17/2016 1528     ) Recent Results (from the past 240 hour(s))  MRSA PCR Screening     Status: None   Collection Time: 12/17/16  5:43 PM  Result Value Ref Range Status   MRSA by PCR NEGATIVE NEGATIVE Final    Comment:        The GeneXpert MRSA Assay (FDA approved for NASAL specimens only), is one component of a comprehensive MRSA colonization surveillance program. It is not intended to diagnose MRSA infection nor to guide or monitor treatment for MRSA infections.       Anti-infectives    Start     Dose/Rate Route Frequency Ordered Stop   12/18/16 1200  cefTRIAXone (ROCEPHIN) 1 g in dextrose 5 % 50 mL IVPB     1 g 100 mL/hr over 30 Minutes Intravenous Every 24 hours 12/17/16 1446 12/25/16 1159   12/17/16 2200  doxycycline  (VIBRA-TABS) tablet 100 mg     100 mg Oral Every 12 hours 12/17/16 1446 12/24/16 2159   12/17/16 1930  oseltamivir (TAMIFLU) capsule 30 mg     30 mg Oral Daily 12/17/16 1925 12/22/16 0959   12/17/16 1315  oseltamivir (TAMIFLU) capsule 75 mg  Status:  Discontinued     75 mg Oral  Once 12/17/16 1314 12/17/16 1925   12/17/16 1215  cefTRIAXone (ROCEPHIN) 1 g in dextrose 5 % 50 mL IVPB     1 g 100 mL/hr over 30 Minutes Intravenous  Once 12/17/16 1212 12/17/16 1304   12/17/16 1215  doxycycline (VIBRA-TABS) tablet 100  mg     100 mg Oral  Once 12/17/16 1212 12/17/16 1239       Radiology Studies: Dg Chest 2 View  Result Date: 12/17/2016 CLINICAL DATA:  Cough, congestion and short of breath for 3 days. EXAM: CHEST  2 VIEW COMPARISON:  09/05/2006 FINDINGS: The heart remains mildly enlarged. Lungs are hyperaerated. Calcified densities at the right lung base are stable. Moderate hiatal hernia is stable. Oval density projects over the left upper lung zone. Considerations include external object or pulmonary nodule. There is a similar density at the right lung base these measure approximately 2 cm in size. No pneumothorax. No pleural effusion. Osteopenia. Stable spine. IMPRESSION: There are new oval densities in the left upper lung zone and right lung base which may be external objects. Pulmonary nodule cannot be excluded. Electronically Signed   By: Marybelle Killings M.D.   On: 12/17/2016 11:13        Scheduled Meds: . albuterol  2.5 mg Nebulization TID  . cefTRIAXone (ROCEPHIN)  IV  1 g Intravenous Q24H  . doxycycline  100 mg Oral Q12H  . enoxaparin (LOVENOX) injection  40 mg Subcutaneous Q24H  . feeding supplement (ENSURE ENLIVE)  237 mL Oral 2 times per day  . oseltamivir  30 mg Oral Daily   Continuous Infusions:   LOS: 1 day    Time spent: 25 min    [place], DO [facility] Pager [phone]  If 7PM-7AM, please contact night-coverage [email or web address] Password  [email or web address] 12/19/2016, 9:01 AM

## 2016-12-19 NOTE — Progress Notes (Signed)
Paged Dr. Olevia Bowens- daughter wanted pt. to have Ativan IV again tonight to help her rest.

## 2016-12-20 DIAGNOSIS — R748 Abnormal levels of other serum enzymes: Secondary | ICD-10-CM

## 2016-12-20 DIAGNOSIS — J189 Pneumonia, unspecified organism: Secondary | ICD-10-CM

## 2016-12-20 DIAGNOSIS — J101 Influenza due to other identified influenza virus with other respiratory manifestations: Secondary | ICD-10-CM

## 2016-12-20 MED ORDER — OSELTAMIVIR PHOSPHATE 30 MG PO CAPS: 30.0000 mg | ORAL_CAPSULE | Freq: Every day | ORAL | 0 refills | Status: AC

## 2016-12-20 MED ORDER — DOXYCYCLINE HYCLATE 100 MG PO TABS
100.0000 mg | ORAL_TABLET | Freq: Two times a day (BID) | ORAL | 0 refills | Status: AC
Start: 2016-12-20 — End: 2016-12-24

## 2016-12-20 MED ORDER — ALBUTEROL SULFATE (2.5 MG/3ML) 0.083% IN NEBU: 2.5000 mg | INHALATION_SOLUTION | Freq: Four times a day (QID) | RESPIRATORY_TRACT | 12 refills | Status: AC | PRN

## 2016-12-20 MED ORDER — ACETAMINOPHEN 325 MG PO TABS: 650.0000 mg | ORAL_TABLET | Freq: Four times a day (QID) | ORAL | 0 refills | Status: AC | PRN

## 2016-12-20 NOTE — Clinical Social Work Placement (Signed)
   CLINICAL SOCIAL WORK PLACEMENT  NOTE  Date:  12/20/2016  Patient Details  Name: [name] MRN: [id number] Date of Birth: 02-May-1925  Clinical Social Work is seeking post-discharge placement for this patient at the [place] level of care (*CSW will initial, date and re-position this form in  chart as items are completed):  Yes   Patient/family provided with [facility] Work Department's list of facilities offering this level of care within the geographic area requested by the patient (or if unable, by the patient's family).  Yes   Patient/family informed of their freedom to choose among providers that offer the needed level of care, that participate in Medicare, Medicaid or managed care program needed by the patient, have an available bed and are willing to accept the patient.  Yes   Patient/family informed of [facility]'s ownership interest in [facility] and [facility], as well as of the fact that they are under no obligation to receive care at these facilities.  [name] submitted to EDS on 12/17/16     [name] number received on 12/17/16     Existing [name] number confirmed on       FL2 transmitted to all facilities in geographic area requested by pt/family on 12/17/16     FL2 transmitted to all facilities within larger geographic area on 12/18/16     Patient informed that his/her managed care company has contracts with or will negotiate with certain facilities, including the following:        Yes   Patient/family informed of bed offers received.  Patient chooses bed at [facility], [place]     Physician recommends and patient chooses bed at      Patient to be transferred to [facility] [place] on 12/20/16.  Patient to be transferred to facility by [facility]     Patient family notified on 12/20/16 of transfer.  Name of family member notified:  Lattie Haw, daughter     PHYSICIAN       Additional Comment:     _______________________________________________ Benard Halsted, [place] 12/20/2016, 12:40 PM

## 2016-12-20 NOTE — Progress Notes (Signed)
Report called to [place]

## 2016-12-20 NOTE — Discharge Summary (Signed)
Physician Discharge Summary  [name]:[id number] DOB: [birth year] DOA: 12/17/2016  PCP: Carolyn Richards, MD  Admit date: 12/17/2016 Discharge date: 12/20/2016  Recommendations for Outpatient Follow-up:  Continue doxycycline and tamiflu as prescribed. Doxycycline is for 4 more days and tamiflu is for 2 more days. Continue palliative care services/ hospice  in SNF.  Discharge Diagnoses:  Principal Problem:   CAP (community acquired pneumonia) Active Problems:   Essential hypertension   Hyperlipidemia   Anemia   Mild malnutrition (HCC)   Elevated troponin   Diarrhea   Influenza A   Goals of care, counseling/discussion   Palliative care by specialist    Discharge Condition: stable   Diet recommendation: as tolerated   History of present illness:   Per brief narrative 12/19/2016 "83 y.o.femalewho presents with marked respiratory distress from Influenza and possible bacterial superinfection and HCAP. Will not treat aggressively w/ Vanc and Cefepime but will start conservatively w/ CTX and doxy. Of note pt was previously on Hospice though it seems the family was unfamiliar with what that designation truly means. Palliative care has been consulted to assist w/ [facility] discussions. Family is very amenable to this. Elevated troponin noted and likely from underlying age related CAD and demand from infection. Pt is not a candidate for emergent treatment should she develop true MI, so will not trend at this time. Suspect diarrhea is likely stress related due to respiratory infection and flu."  Hospital Course:   Influenza A - Continue Tamiflu for 2 more days on discharge   CAP - Continue doxycycline for 4 more days on discharge - Stop rocephin prior to discharge   Failure to thrive - In the context of chronic illness - D/C to SNF   Elevated troponin - Likely demand ischemia from infection, did not trend as not candidate for intervention  Severe  dementia Hospice to resume at d/C to SNF although patient not really improving and may need residential hospice Low bed for fall precautions   DVT prophylaxis:  Lovenox   Code Status: DNR   Family Communication: Spoke with pt son this am over the phone; he is okay with d/c to SNF if bed available    Consultants:   Palliative care    Signed:  Leisa Lenz, MD  [facility] 12/20/2016, 9:40 AM  Pager #: [phone]  Time spent in minutes: less than 30 minutes   Discharge Exam: Vitals:   12/19/16 2140 12/20/16 0647  BP: 139/88 (!) 140/92  Pulse: (!) 108 85  Resp: 18 16  Temp: 98.2 F (36.8 C) 98.7 F (37.1 C)   Vitals:   12/19/16 0958 12/19/16 1500 12/19/16 2140 12/20/16 0647  BP:  (!) 147/61 139/88 (!) 140/92  Pulse:  77 (!) 108 85  Resp:  16 18 16   Temp:  97.9 F (36.6 C) 98.2 F (36.8 C) 98.7 F (37.1 C)  TempSrc:  Oral  Axillary  SpO2: 94% 97% 93% 94%  Weight:      Height:        General: Pt is alert, follows commands appropriately, not in acute distress Cardiovascular: Rate controlled, S1/S2 + Respiratory: Clear to auscultation bilaterally, no wheezing, no crackles, no rhonchi Abdominal: Soft, non tender, non distended, bowel sounds +, no guarding Extremities: no cyanosis, pulses palpable bilaterally DP and PT Neuro: Grossly nonfocal  Discharge Instructions  Discharge Instructions    Call MD for:  persistant nausea and vomiting    Complete by:  As directed    Call MD  for:  redness, tenderness, or signs of infection (pain, swelling, redness, odor or green/yellow discharge around incision site)    Complete by:  As directed    Call MD for:  severe uncontrolled pain    Complete by:  As directed    Diet - low sodium heart healthy    Complete by:  As directed    Discharge instructions    Complete by:  As directed    Continue doxycycline and tamiflu as prescribed. Doxycycline is for 4 more days and tamiflu is for 2 more days.    Increase activity slowly    Complete by:  As directed      Allergies as of 12/20/2016      Reactions   Codeine Nausea And Vomiting   Sulfa Antibiotics Nausea And Vomiting   Ventolin [albuterol] Other (See Comments)   tremor   Desyrel [trazodone] Other (See Comments)   Penicillins Other (See Comments)   "childhood"   Theophyllines Other (See Comments)      Medication List    STOP taking these medications   levofloxacin 500 MG tablet Commonly known as:  LEVAQUIN   LORazepam 0.5 MG tablet Commonly known as:  ATIVAN     TAKE these medications   acetaminophen 325 MG tablet Commonly known as:  TYLENOL Take 2 tablets (650 mg total) by mouth every 6 (six) hours as needed for mild pain (or Fever >/= 101).   albuterol (2.5 MG/3ML) 0.083% nebulizer solution Commonly known as:  PROVENTIL Take 3 mLs (2.5 mg total) by nebulization 4 (four) times daily as needed for wheezing or shortness of breath.   aspirin EC 81 MG tablet Take 81 mg by mouth daily.   doxycycline 100 MG tablet Commonly known as:  VIBRA-TABS Take 1 tablet (100 mg total) by mouth every 12 (twelve) hours.   NUTRA/SHAKE FIBRE PO Take 1 Container by mouth 3 (three) times daily.   oseltamivir 30 MG capsule Commonly known as:  TAMIFLU Take 1 capsule (30 mg total) by mouth daily. Start taking on:  12/21/2016   Vitamin D3 5000 units Caps Take 5,000 Units by mouth daily.       Contact information for follow-up providers    MCKEOWN,WILLIAM DAVID, MD. Schedule an appointment as soon as possible for a visit in 1 week(s).   Specialty:  Internal Medicine Contact information: [address] [place] [address] [phone]            Contact information for after-discharge care    Destination    [facility] Follow up.   Specialty:  [place] information: [place] [phone]                    The results of significant diagnostics from this hospitalization (including imaging, microbiology, ancillary and laboratory) are listed below for reference.    Significant Diagnostic Studies: Dg Chest 2 View  Result Date: 12/17/2016 CLINICAL DATA:  Cough, congestion and short of breath for 3 days. EXAM: CHEST  2 VIEW COMPARISON:  09/05/2006 FINDINGS: The heart remains mildly enlarged. Lungs are hyperaerated. Calcified densities at the right lung base are stable. Moderate hiatal hernia is stable. Oval density projects over the left upper lung zone. Considerations include external object or pulmonary nodule. There is a similar density at the right lung base these measure approximately 2 cm in size. No pneumothorax. No pleural effusion. Osteopenia. Stable spine. IMPRESSION: There are new  oval densities in the left upper lung zone and right lung base which may be external objects. Pulmonary nodule cannot be excluded. Electronically Signed   By: Marybelle Killings M.D.   On: 12/17/2016 11:13    Microbiology: Recent Results (from the past 240 hour(s))  MRSA PCR Screening     Status: None   Collection Time: 12/17/16  5:43 PM  Result Value Ref Range Status   MRSA by PCR NEGATIVE NEGATIVE Final    Comment:        The GeneXpert MRSA Assay (FDA approved for NASAL specimens only), is one component of a comprehensive MRSA colonization surveillance program. It is not intended to diagnose MRSA infection nor to guide or monitor treatment for MRSA infections.      Labs: Basic Metabolic Panel:  Recent Labs Lab 12/17/16 1049 12/18/16 0341  NA 139 141  K 4.1 3.6  CL 101 105  CO2 26 27  GLUCOSE 95 82  BUN 25* 14  CREATININE 0.97 0.69  CALCIUM 9.3 8.1*   Liver Function Tests: No results for input(s): AST, ALT, ALKPHOS, BILITOT, PROT, ALBUMIN in the last 168 hours. No results for input(s): LIPASE, AMYLASE in the last 168 hours. No results for input(s): AMMONIA in the last 168  hours. CBC:  Recent Labs Lab 12/17/16 1049 12/18/16 0341  WBC 3.4* 4.6  HGB 12.5 10.8*  HCT 37.9 34.7*  MCV 97.7 94.8  PLT 199 163   Cardiac Enzymes:  Recent Labs Lab 12/17/16 1049  TROPONINI 0.04*   BNP: BNP (last 3 results) No results for input(s): BNP in the last 8760 hours.  ProBNP (last 3 results) No results for input(s): PROBNP in the last 8760 hours.  CBG: No results for input(s): GLUCAP in the last 168 hours.

## 2016-12-20 NOTE — Progress Notes (Signed)
Juliann Pares to be D/C'd Skilled nursing facility per MD order. Discussed with the patient and all questions fully answered.  Allergies as of 12/20/2016      Reactions   Codeine Nausea And Vomiting   Sulfa Antibiotics Nausea And Vomiting   Ventolin [albuterol] Other (See Comments)   tremor   Desyrel [trazodone] Other (See Comments)   Penicillins Other (See Comments)   "childhood"   Theophyllines Other (See Comments)      Medication List    STOP taking these medications   levofloxacin 500 MG tablet Commonly known as:  LEVAQUIN   LORazepam 0.5 MG tablet Commonly known as:  ATIVAN     TAKE these medications   acetaminophen 325 MG tablet Commonly known as:  TYLENOL Take 2 tablets (650 mg total) by mouth every 6 (six) hours as needed for mild pain (or Fever >/= 101).   albuterol (2.5 MG/3ML) 0.083% nebulizer solution Commonly known as:  PROVENTIL Take 3 mLs (2.5 mg total) by nebulization 4 (four) times daily as needed for wheezing or shortness of breath.   aspirin EC 81 MG tablet Take 81 mg by mouth daily.   doxycycline 100 MG tablet Commonly known as:  VIBRA-TABS Take 1 tablet (100 mg total) by mouth every 12 (twelve) hours.   NUTRA/SHAKE FIBRE PO Take 1 Container by mouth 3 (three) times daily.   oseltamivir 30 MG capsule Commonly known as:  TAMIFLU Take 1 capsule (30 mg total) by mouth daily. Start taking on:  12/21/2016   Vitamin D3 5000 units Caps Take 5,000 Units by mouth daily.       VVS, Skin clean, dry and intact without evidence of skin break down, no evidence of skin tears noted.  IV catheter discontinued intact. Site without signs and symptoms of complications. Dressing and pressure applied.  An After Visit Summary was printed and given to the patient.  Patient escorted via stretcher, and D/C SNF via EMS.  Fabian November  12/20/2016 1:15 PM

## 2016-12-20 NOTE — Progress Notes (Signed)
Patient will DC to: [facility]  Anticipated DC date: 12/20/16 Family notified: Daughter Transport by: Corey Harold   Per MD patient ready for DC to [facility]. RN, patient, patient's family, and facility notified of DC. Discharge Summary sent to facility. RN given number for report. DC packet on chart. Ambulance transport requested for patient.   CSW signing off.  Cedric Fishman, [place] Social Worker [phone]

## 2016-12-20 NOTE — Care Management Note (Signed)
Case Management Note  Patient Details  Name: [name] MRN: [id number] Date of Birth: 03/20/1925  Subjective/Objective:                 DC to [facility] with hospice. CM notified Bambi, hospice coordinator of DC today.   Action/Plan:   Expected Discharge Date:  12/20/16               Expected Discharge Plan:  [place]  In-House Referral:  NA  Discharge planning Services     Post Acute Care Choice:    Choice offered to:     DME Arranged:    DME Agency:     HH Arranged:    [facility] (From [facility] with home hospice ([facility]). Bambi following.)  Status of Service:  Completed, signed off  If discussed at [place] of Stay Meetings, dates discussed:    Additional Comments:  Carles Collet, RN 12/20/2016, 9:56 AM

## 2016-12-20 NOTE — Discharge Instructions (Signed)
Community-Acquired Pneumonia, Adult Pneumonia is an infection of the lungs. One type of pneumonia can happen while a person is in a hospital. A different type can happen when a person is not in a hospital (community-acquired pneumonia). It is easy for this kind to spread from person to person. It can spread to you if you breathe near an infected person who coughs or sneezes. Some symptoms include:  A dry cough.  A wet (productive) cough.  Fever.  Sweating.  Chest pain. Follow these instructions at home:  Take over-the-counter and prescription medicines only as told by your doctor.  Only take cough medicine if you are losing sleep.  If you were prescribed an antibiotic medicine, take it as told by your doctor. Do not stop taking the antibiotic even if you start to feel better.  Sleep with your head and neck raised (elevated). You can do this by putting a few pillows under your head, or you can sleep in a recliner.  Do not use tobacco products. These include cigarettes, chewing tobacco, and e-cigarettes. If you need help quitting, ask your doctor.  Drink enough water to keep your pee (urine) clear or pale yellow. A shot (vaccine) can help prevent pneumonia. Shots are often suggested for:  People older than 81 years of age.  People older than 81 years of age:  Who are having cancer treatment.  Who have long-term (chronic) lung disease.  Who have problems with their body's defense system (immune system). You may also prevent pneumonia if you take these actions:  Get the flu (influenza) shot every year.  Go to the dentist as often as told.  Wash your hands often. If soap and water are not available, use hand sanitizer. Contact a doctor if:  You have a fever.  You lose sleep because your cough medicine does not help. Get help right away if:  You are short of breath and it gets worse.  You have more chest pain.  Your sickness gets worse. This is very serious if:  You  are an older adult.  Your body's defense system is weak.  You cough up blood. This information is not intended to replace advice given to you by your health care provider. Make sure you discuss any questions you have with your health care provider. Document Released: 03/03/2008 Document Revised: 02/21/2016 Document Reviewed: 01/10/2015 Elsevier Interactive Patient Education  2017 Elsevier Inc. Oseltamivir capsules What is this medicine? OSELTAMIVIR (os el TAM i vir) is an antiviral medicine. It is used to prevent and to treat some kinds of influenza or the flu. It will not work for colds or other viral infections. This medicine may be used for other purposes; ask your health care provider or pharmacist if you have questions. COMMON BRAND NAME(S): Tamiflu What should I tell my health care provider before I take this medicine? They need to know if you have any of the following conditions: -heart disease -immune system problems -kidney disease -liver disease -lung disease -an unusual or allergic reaction to oseltamivir, other medicines, foods, dyes, or preservatives -pregnant or trying to get pregnant -breast-feeding How should I use this medicine? Take this medicine by mouth with a glass of water. Follow the directions on the prescription label. Start this medicine at the first sign of flu symptoms. You can take it with or without food. If it upsets your stomach, take it with food. Take your medicine at regular intervals. Do not take your medicine more often than directed. Take all of  your medicine as directed even if you think you are better. Do not skip doses or stop your medicine early. Talk to your pediatrician regarding the use of this medicine in children. While this drug may be prescribed for children as young as 14 days for selected conditions, precautions do apply. Overdosage: If you think you have taken too much of this medicine contact a poison control center or emergency room at  once. NOTE: This medicine is only for you. Do not share this medicine with others. What if I miss a dose? If you miss a dose, take it as soon as you remember. If it is almost time for your next dose (within 2 hours), take only that dose. Do not take double or extra doses. What may interact with this medicine? Interactions are not expected. This list may not describe all possible interactions. Give your health care provider a list of all the medicines, herbs, non-prescription drugs, or dietary supplements you use. Also tell them if you smoke, drink alcohol, or use illegal drugs. Some items may interact with your medicine. What should I watch for while using this medicine? Visit your doctor or health care professional for regular check ups. Tell your doctor if your symptoms do not start to get better or if they get worse. If you have the flu, you may be at an increased risk of developing seizures, confusion, or abnormal behavior. This occurs early in the illness, and more frequently in children and teens. These events are not common, but may result in accidental injury to the patient. Families and caregivers of patients should watch for signs of unusual behavior and contact a doctor or health care professional right away if the patient shows signs of unusual behavior. This medicine is not a substitute for the flu shot. Talk to your doctor each year about an annual flu shot. What side effects may I notice from receiving this medicine? Side effects that you should report to your doctor or health care professional as soon as possible: -allergic reactions like skin rash, itching or hives, swelling of the face, lips, or tongue -anxiety, confusion, unusual behavior -breathing problems -hallucination, loss of contact with reality -redness, blistering, peeling or loosening of the skin, including inside the mouth -seizures Side effects that usually do not require medical attention (report to your doctor or  health care professional if they continue or are bothersome): -diarrhea -headache -nausea, vomiting -pain This list may not describe all possible side effects. Call your doctor for medical advice about side effects. You may report side effects to FDA at 1-800-FDA-1088. Where should I keep my medicine? Keep out of the reach of children. Store at room temperature between 15 and 30 degrees C (59 and 86 degrees F). Throw away any unused medicine after the expiration date. NOTE: This sheet is a summary. It may not cover all possible information. If you have questions about this medicine, talk to your doctor, pharmacist, or health care provider.  2018 Elsevier/Gold Standard (2015-03-21 10:50:39)

## 2016-12-21 DIAGNOSIS — G308 Other Alzheimer's disease: Secondary | ICD-10-CM | POA: Diagnosis not present

## 2016-12-21 DIAGNOSIS — J09X1 Influenza due to identified novel influenza A virus with pneumonia: Secondary | ICD-10-CM | POA: Diagnosis not present

## 2016-12-21 DIAGNOSIS — R531 Weakness: Secondary | ICD-10-CM | POA: Diagnosis not present

## 2016-12-21 DIAGNOSIS — J449 Chronic obstructive pulmonary disease, unspecified: Secondary | ICD-10-CM | POA: Diagnosis not present

## 2016-12-21 DIAGNOSIS — R2689 Other abnormalities of gait and mobility: Secondary | ICD-10-CM | POA: Diagnosis not present

## 2016-12-21 DIAGNOSIS — J188 Other pneumonia, unspecified organism: Secondary | ICD-10-CM | POA: Diagnosis not present

## 2016-12-21 DIAGNOSIS — I10 Essential (primary) hypertension: Secondary | ICD-10-CM | POA: Diagnosis not present

## 2016-12-21 DIAGNOSIS — I2510 Atherosclerotic heart disease of native coronary artery without angina pectoris: Secondary | ICD-10-CM | POA: Diagnosis not present

## 2016-12-24 DIAGNOSIS — Z853 Personal history of malignant neoplasm of breast: Secondary | ICD-10-CM | POA: Diagnosis not present

## 2016-12-24 DIAGNOSIS — E785 Hyperlipidemia, unspecified: Secondary | ICD-10-CM | POA: Diagnosis not present

## 2016-12-24 DIAGNOSIS — K219 Gastro-esophageal reflux disease without esophagitis: Secondary | ICD-10-CM | POA: Diagnosis not present

## 2016-12-24 DIAGNOSIS — I10 Essential (primary) hypertension: Secondary | ICD-10-CM | POA: Diagnosis not present

## 2016-12-24 DIAGNOSIS — R630 Anorexia: Secondary | ICD-10-CM | POA: Diagnosis not present

## 2016-12-24 DIAGNOSIS — G309 Alzheimer's disease, unspecified: Secondary | ICD-10-CM | POA: Diagnosis not present

## 2016-12-24 DIAGNOSIS — I25810 Atherosclerosis of coronary artery bypass graft(s) without angina pectoris: Secondary | ICD-10-CM | POA: Diagnosis not present

## 2016-12-24 DIAGNOSIS — R634 Abnormal weight loss: Secondary | ICD-10-CM | POA: Diagnosis not present

## 2016-12-24 DIAGNOSIS — E46 Unspecified protein-calorie malnutrition: Secondary | ICD-10-CM | POA: Diagnosis not present

## 2016-12-24 DIAGNOSIS — N183 Chronic kidney disease, stage 3 (moderate): Secondary | ICD-10-CM | POA: Diagnosis not present

## 2016-12-24 DIAGNOSIS — R54 Age-related physical debility: Secondary | ICD-10-CM | POA: Diagnosis not present

## 2016-12-24 DIAGNOSIS — J449 Chronic obstructive pulmonary disease, unspecified: Secondary | ICD-10-CM | POA: Diagnosis not present

## 2016-12-25 DIAGNOSIS — I25810 Atherosclerosis of coronary artery bypass graft(s) without angina pectoris: Secondary | ICD-10-CM | POA: Diagnosis not present

## 2016-12-25 DIAGNOSIS — I10 Essential (primary) hypertension: Secondary | ICD-10-CM | POA: Diagnosis not present

## 2016-12-25 DIAGNOSIS — J449 Chronic obstructive pulmonary disease, unspecified: Secondary | ICD-10-CM | POA: Diagnosis not present

## 2016-12-25 DIAGNOSIS — E46 Unspecified protein-calorie malnutrition: Secondary | ICD-10-CM | POA: Diagnosis not present

## 2016-12-25 DIAGNOSIS — E785 Hyperlipidemia, unspecified: Secondary | ICD-10-CM | POA: Diagnosis not present

## 2016-12-25 DIAGNOSIS — N183 Chronic kidney disease, stage 3 (moderate): Secondary | ICD-10-CM | POA: Diagnosis not present

## 2016-12-28 DIAGNOSIS — R630 Anorexia: Secondary | ICD-10-CM | POA: Diagnosis not present

## 2016-12-28 DIAGNOSIS — I25810 Atherosclerosis of coronary artery bypass graft(s) without angina pectoris: Secondary | ICD-10-CM | POA: Diagnosis not present

## 2016-12-28 DIAGNOSIS — N183 Chronic kidney disease, stage 3 (moderate): Secondary | ICD-10-CM | POA: Diagnosis not present

## 2016-12-28 DIAGNOSIS — G309 Alzheimer's disease, unspecified: Secondary | ICD-10-CM | POA: Diagnosis not present

## 2016-12-28 DIAGNOSIS — E785 Hyperlipidemia, unspecified: Secondary | ICD-10-CM | POA: Diagnosis not present

## 2016-12-28 DIAGNOSIS — E46 Unspecified protein-calorie malnutrition: Secondary | ICD-10-CM | POA: Diagnosis not present

## 2016-12-28 DIAGNOSIS — I10 Essential (primary) hypertension: Secondary | ICD-10-CM | POA: Diagnosis not present

## 2016-12-28 DIAGNOSIS — Z853 Personal history of malignant neoplasm of breast: Secondary | ICD-10-CM | POA: Diagnosis not present

## 2016-12-28 DIAGNOSIS — K219 Gastro-esophageal reflux disease without esophagitis: Secondary | ICD-10-CM | POA: Diagnosis not present

## 2016-12-28 DIAGNOSIS — J449 Chronic obstructive pulmonary disease, unspecified: Secondary | ICD-10-CM | POA: Diagnosis not present

## 2016-12-28 DIAGNOSIS — R634 Abnormal weight loss: Secondary | ICD-10-CM | POA: Diagnosis not present

## 2016-12-28 DIAGNOSIS — R54 Age-related physical debility: Secondary | ICD-10-CM | POA: Diagnosis not present

## 2016-12-30 DIAGNOSIS — E46 Unspecified protein-calorie malnutrition: Secondary | ICD-10-CM | POA: Diagnosis not present

## 2016-12-30 DIAGNOSIS — E785 Hyperlipidemia, unspecified: Secondary | ICD-10-CM | POA: Diagnosis not present

## 2016-12-30 DIAGNOSIS — J449 Chronic obstructive pulmonary disease, unspecified: Secondary | ICD-10-CM | POA: Diagnosis not present

## 2016-12-30 DIAGNOSIS — I25810 Atherosclerosis of coronary artery bypass graft(s) without angina pectoris: Secondary | ICD-10-CM | POA: Diagnosis not present

## 2016-12-30 DIAGNOSIS — N183 Chronic kidney disease, stage 3 (moderate): Secondary | ICD-10-CM | POA: Diagnosis not present

## 2016-12-30 DIAGNOSIS — I10 Essential (primary) hypertension: Secondary | ICD-10-CM | POA: Diagnosis not present

## 2017-01-02 DIAGNOSIS — N183 Chronic kidney disease, stage 3 (moderate): Secondary | ICD-10-CM | POA: Diagnosis not present

## 2017-01-02 DIAGNOSIS — J449 Chronic obstructive pulmonary disease, unspecified: Secondary | ICD-10-CM | POA: Diagnosis not present

## 2017-01-02 DIAGNOSIS — E46 Unspecified protein-calorie malnutrition: Secondary | ICD-10-CM | POA: Diagnosis not present

## 2017-01-02 DIAGNOSIS — E785 Hyperlipidemia, unspecified: Secondary | ICD-10-CM | POA: Diagnosis not present

## 2017-01-02 DIAGNOSIS — I10 Essential (primary) hypertension: Secondary | ICD-10-CM | POA: Diagnosis not present

## 2017-01-02 DIAGNOSIS — I25810 Atherosclerosis of coronary artery bypass graft(s) without angina pectoris: Secondary | ICD-10-CM | POA: Diagnosis not present

## 2017-01-05 DIAGNOSIS — I25810 Atherosclerosis of coronary artery bypass graft(s) without angina pectoris: Secondary | ICD-10-CM | POA: Diagnosis not present

## 2017-01-05 DIAGNOSIS — N183 Chronic kidney disease, stage 3 (moderate): Secondary | ICD-10-CM | POA: Diagnosis not present

## 2017-01-05 DIAGNOSIS — I10 Essential (primary) hypertension: Secondary | ICD-10-CM | POA: Diagnosis not present

## 2017-01-05 DIAGNOSIS — E785 Hyperlipidemia, unspecified: Secondary | ICD-10-CM | POA: Diagnosis not present

## 2017-01-05 DIAGNOSIS — J449 Chronic obstructive pulmonary disease, unspecified: Secondary | ICD-10-CM | POA: Diagnosis not present

## 2017-01-05 DIAGNOSIS — E46 Unspecified protein-calorie malnutrition: Secondary | ICD-10-CM | POA: Diagnosis not present

## 2017-01-06 DIAGNOSIS — N183 Chronic kidney disease, stage 3 (moderate): Secondary | ICD-10-CM | POA: Diagnosis not present

## 2017-01-06 DIAGNOSIS — J449 Chronic obstructive pulmonary disease, unspecified: Secondary | ICD-10-CM | POA: Diagnosis not present

## 2017-01-06 DIAGNOSIS — E785 Hyperlipidemia, unspecified: Secondary | ICD-10-CM | POA: Diagnosis not present

## 2017-01-06 DIAGNOSIS — I10 Essential (primary) hypertension: Secondary | ICD-10-CM | POA: Diagnosis not present

## 2017-01-06 DIAGNOSIS — E46 Unspecified protein-calorie malnutrition: Secondary | ICD-10-CM | POA: Diagnosis not present

## 2017-01-06 DIAGNOSIS — I25810 Atherosclerosis of coronary artery bypass graft(s) without angina pectoris: Secondary | ICD-10-CM | POA: Diagnosis not present

## 2017-01-09 DIAGNOSIS — E46 Unspecified protein-calorie malnutrition: Secondary | ICD-10-CM | POA: Diagnosis not present

## 2017-01-09 DIAGNOSIS — I25810 Atherosclerosis of coronary artery bypass graft(s) without angina pectoris: Secondary | ICD-10-CM | POA: Diagnosis not present

## 2017-01-09 DIAGNOSIS — N183 Chronic kidney disease, stage 3 (moderate): Secondary | ICD-10-CM | POA: Diagnosis not present

## 2017-01-09 DIAGNOSIS — J449 Chronic obstructive pulmonary disease, unspecified: Secondary | ICD-10-CM | POA: Diagnosis not present

## 2017-01-09 DIAGNOSIS — E785 Hyperlipidemia, unspecified: Secondary | ICD-10-CM | POA: Diagnosis not present

## 2017-01-09 DIAGNOSIS — I10 Essential (primary) hypertension: Secondary | ICD-10-CM | POA: Diagnosis not present

## 2017-01-12 DIAGNOSIS — E46 Unspecified protein-calorie malnutrition: Secondary | ICD-10-CM | POA: Diagnosis not present

## 2017-01-12 DIAGNOSIS — J449 Chronic obstructive pulmonary disease, unspecified: Secondary | ICD-10-CM | POA: Diagnosis not present

## 2017-01-12 DIAGNOSIS — N183 Chronic kidney disease, stage 3 (moderate): Secondary | ICD-10-CM | POA: Diagnosis not present

## 2017-01-12 DIAGNOSIS — I10 Essential (primary) hypertension: Secondary | ICD-10-CM | POA: Diagnosis not present

## 2017-01-12 DIAGNOSIS — I25810 Atherosclerosis of coronary artery bypass graft(s) without angina pectoris: Secondary | ICD-10-CM | POA: Diagnosis not present

## 2017-01-12 DIAGNOSIS — E785 Hyperlipidemia, unspecified: Secondary | ICD-10-CM | POA: Diagnosis not present

## 2017-01-13 DIAGNOSIS — I25810 Atherosclerosis of coronary artery bypass graft(s) without angina pectoris: Secondary | ICD-10-CM | POA: Diagnosis not present

## 2017-01-13 DIAGNOSIS — E785 Hyperlipidemia, unspecified: Secondary | ICD-10-CM | POA: Diagnosis not present

## 2017-01-13 DIAGNOSIS — N183 Chronic kidney disease, stage 3 (moderate): Secondary | ICD-10-CM | POA: Diagnosis not present

## 2017-01-13 DIAGNOSIS — I10 Essential (primary) hypertension: Secondary | ICD-10-CM | POA: Diagnosis not present

## 2017-01-13 DIAGNOSIS — J449 Chronic obstructive pulmonary disease, unspecified: Secondary | ICD-10-CM | POA: Diagnosis not present

## 2017-01-13 DIAGNOSIS — E46 Unspecified protein-calorie malnutrition: Secondary | ICD-10-CM | POA: Diagnosis not present

## 2017-01-16 DIAGNOSIS — I25810 Atherosclerosis of coronary artery bypass graft(s) without angina pectoris: Secondary | ICD-10-CM | POA: Diagnosis not present

## 2017-01-16 DIAGNOSIS — E785 Hyperlipidemia, unspecified: Secondary | ICD-10-CM | POA: Diagnosis not present

## 2017-01-16 DIAGNOSIS — I10 Essential (primary) hypertension: Secondary | ICD-10-CM | POA: Diagnosis not present

## 2017-01-16 DIAGNOSIS — N183 Chronic kidney disease, stage 3 (moderate): Secondary | ICD-10-CM | POA: Diagnosis not present

## 2017-01-16 DIAGNOSIS — E46 Unspecified protein-calorie malnutrition: Secondary | ICD-10-CM | POA: Diagnosis not present

## 2017-01-16 DIAGNOSIS — J449 Chronic obstructive pulmonary disease, unspecified: Secondary | ICD-10-CM | POA: Diagnosis not present

## 2017-01-19 DIAGNOSIS — E46 Unspecified protein-calorie malnutrition: Secondary | ICD-10-CM | POA: Diagnosis not present

## 2017-01-19 DIAGNOSIS — I10 Essential (primary) hypertension: Secondary | ICD-10-CM | POA: Diagnosis not present

## 2017-01-19 DIAGNOSIS — N183 Chronic kidney disease, stage 3 (moderate): Secondary | ICD-10-CM | POA: Diagnosis not present

## 2017-01-19 DIAGNOSIS — I25810 Atherosclerosis of coronary artery bypass graft(s) without angina pectoris: Secondary | ICD-10-CM | POA: Diagnosis not present

## 2017-01-19 DIAGNOSIS — J449 Chronic obstructive pulmonary disease, unspecified: Secondary | ICD-10-CM | POA: Diagnosis not present

## 2017-01-19 DIAGNOSIS — E785 Hyperlipidemia, unspecified: Secondary | ICD-10-CM | POA: Diagnosis not present

## 2017-01-20 DIAGNOSIS — E785 Hyperlipidemia, unspecified: Secondary | ICD-10-CM | POA: Diagnosis not present

## 2017-01-20 DIAGNOSIS — N183 Chronic kidney disease, stage 3 (moderate): Secondary | ICD-10-CM | POA: Diagnosis not present

## 2017-01-20 DIAGNOSIS — E46 Unspecified protein-calorie malnutrition: Secondary | ICD-10-CM | POA: Diagnosis not present

## 2017-01-20 DIAGNOSIS — I10 Essential (primary) hypertension: Secondary | ICD-10-CM | POA: Diagnosis not present

## 2017-01-20 DIAGNOSIS — I25810 Atherosclerosis of coronary artery bypass graft(s) without angina pectoris: Secondary | ICD-10-CM | POA: Diagnosis not present

## 2017-01-20 DIAGNOSIS — J449 Chronic obstructive pulmonary disease, unspecified: Secondary | ICD-10-CM | POA: Diagnosis not present

## 2017-01-21 DIAGNOSIS — E46 Unspecified protein-calorie malnutrition: Secondary | ICD-10-CM | POA: Diagnosis not present

## 2017-01-21 DIAGNOSIS — J449 Chronic obstructive pulmonary disease, unspecified: Secondary | ICD-10-CM | POA: Diagnosis not present

## 2017-01-21 DIAGNOSIS — N183 Chronic kidney disease, stage 3 (moderate): Secondary | ICD-10-CM | POA: Diagnosis not present

## 2017-01-21 DIAGNOSIS — I25810 Atherosclerosis of coronary artery bypass graft(s) without angina pectoris: Secondary | ICD-10-CM | POA: Diagnosis not present

## 2017-01-21 DIAGNOSIS — I10 Essential (primary) hypertension: Secondary | ICD-10-CM | POA: Diagnosis not present

## 2017-01-21 DIAGNOSIS — E785 Hyperlipidemia, unspecified: Secondary | ICD-10-CM | POA: Diagnosis not present

## 2017-01-23 DIAGNOSIS — I10 Essential (primary) hypertension: Secondary | ICD-10-CM | POA: Diagnosis not present

## 2017-01-23 DIAGNOSIS — E46 Unspecified protein-calorie malnutrition: Secondary | ICD-10-CM | POA: Diagnosis not present

## 2017-01-23 DIAGNOSIS — E785 Hyperlipidemia, unspecified: Secondary | ICD-10-CM | POA: Diagnosis not present

## 2017-01-23 DIAGNOSIS — M79632 Pain in left forearm: Secondary | ICD-10-CM | POA: Diagnosis not present

## 2017-01-23 DIAGNOSIS — J449 Chronic obstructive pulmonary disease, unspecified: Secondary | ICD-10-CM | POA: Diagnosis not present

## 2017-01-23 DIAGNOSIS — N183 Chronic kidney disease, stage 3 (moderate): Secondary | ICD-10-CM | POA: Diagnosis not present

## 2017-01-23 DIAGNOSIS — I25810 Atherosclerosis of coronary artery bypass graft(s) without angina pectoris: Secondary | ICD-10-CM | POA: Diagnosis not present

## 2017-01-26 DIAGNOSIS — J449 Chronic obstructive pulmonary disease, unspecified: Secondary | ICD-10-CM | POA: Diagnosis not present

## 2017-01-26 DIAGNOSIS — I25810 Atherosclerosis of coronary artery bypass graft(s) without angina pectoris: Secondary | ICD-10-CM | POA: Diagnosis not present

## 2017-01-26 DIAGNOSIS — E785 Hyperlipidemia, unspecified: Secondary | ICD-10-CM | POA: Diagnosis not present

## 2017-01-26 DIAGNOSIS — E46 Unspecified protein-calorie malnutrition: Secondary | ICD-10-CM | POA: Diagnosis not present

## 2017-01-26 DIAGNOSIS — N183 Chronic kidney disease, stage 3 (moderate): Secondary | ICD-10-CM | POA: Diagnosis not present

## 2017-01-26 DIAGNOSIS — I10 Essential (primary) hypertension: Secondary | ICD-10-CM | POA: Diagnosis not present

## 2017-01-27 DIAGNOSIS — N183 Chronic kidney disease, stage 3 (moderate): Secondary | ICD-10-CM | POA: Diagnosis not present

## 2017-01-27 DIAGNOSIS — I10 Essential (primary) hypertension: Secondary | ICD-10-CM | POA: Diagnosis not present

## 2017-01-27 DIAGNOSIS — K219 Gastro-esophageal reflux disease without esophagitis: Secondary | ICD-10-CM | POA: Diagnosis not present

## 2017-01-27 DIAGNOSIS — R54 Age-related physical debility: Secondary | ICD-10-CM | POA: Diagnosis not present

## 2017-01-27 DIAGNOSIS — E785 Hyperlipidemia, unspecified: Secondary | ICD-10-CM | POA: Diagnosis not present

## 2017-01-27 DIAGNOSIS — I25810 Atherosclerosis of coronary artery bypass graft(s) without angina pectoris: Secondary | ICD-10-CM | POA: Diagnosis not present

## 2017-01-27 DIAGNOSIS — R630 Anorexia: Secondary | ICD-10-CM | POA: Diagnosis not present

## 2017-01-27 DIAGNOSIS — G309 Alzheimer's disease, unspecified: Secondary | ICD-10-CM | POA: Diagnosis not present

## 2017-01-27 DIAGNOSIS — E46 Unspecified protein-calorie malnutrition: Secondary | ICD-10-CM | POA: Diagnosis not present

## 2017-01-27 DIAGNOSIS — Z853 Personal history of malignant neoplasm of breast: Secondary | ICD-10-CM | POA: Diagnosis not present

## 2017-01-27 DIAGNOSIS — R634 Abnormal weight loss: Secondary | ICD-10-CM | POA: Diagnosis not present

## 2017-01-27 DIAGNOSIS — J449 Chronic obstructive pulmonary disease, unspecified: Secondary | ICD-10-CM | POA: Diagnosis not present

## 2017-01-28 DIAGNOSIS — G308 Other Alzheimer's disease: Secondary | ICD-10-CM | POA: Diagnosis not present

## 2017-01-28 DIAGNOSIS — S30810A Abrasion of lower back and pelvis, initial encounter: Secondary | ICD-10-CM | POA: Diagnosis not present

## 2017-01-28 DIAGNOSIS — F0280 Dementia in other diseases classified elsewhere without behavioral disturbance: Secondary | ICD-10-CM | POA: Diagnosis not present

## 2017-01-28 DIAGNOSIS — F418 Other specified anxiety disorders: Secondary | ICD-10-CM | POA: Diagnosis not present

## 2017-01-30 DIAGNOSIS — M1991 Primary osteoarthritis, unspecified site: Secondary | ICD-10-CM | POA: Diagnosis not present

## 2017-01-30 DIAGNOSIS — G309 Alzheimer's disease, unspecified: Secondary | ICD-10-CM | POA: Diagnosis not present

## 2017-02-01 DIAGNOSIS — G309 Alzheimer's disease, unspecified: Secondary | ICD-10-CM | POA: Diagnosis not present

## 2017-02-01 DIAGNOSIS — M1991 Primary osteoarthritis, unspecified site: Secondary | ICD-10-CM | POA: Diagnosis not present

## 2017-02-04 DIAGNOSIS — G308 Other Alzheimer's disease: Secondary | ICD-10-CM | POA: Diagnosis not present

## 2017-02-04 DIAGNOSIS — G309 Alzheimer's disease, unspecified: Secondary | ICD-10-CM | POA: Diagnosis not present

## 2017-02-04 DIAGNOSIS — R198 Other specified symptoms and signs involving the digestive system and abdomen: Secondary | ICD-10-CM | POA: Diagnosis not present

## 2017-02-04 DIAGNOSIS — M1991 Primary osteoarthritis, unspecified site: Secondary | ICD-10-CM | POA: Diagnosis not present

## 2017-02-04 DIAGNOSIS — F0280 Dementia in other diseases classified elsewhere without behavioral disturbance: Secondary | ICD-10-CM | POA: Diagnosis not present

## 2017-02-05 DIAGNOSIS — M1991 Primary osteoarthritis, unspecified site: Secondary | ICD-10-CM | POA: Diagnosis not present

## 2017-02-05 DIAGNOSIS — G309 Alzheimer's disease, unspecified: Secondary | ICD-10-CM | POA: Diagnosis not present

## 2017-02-06 DIAGNOSIS — G309 Alzheimer's disease, unspecified: Secondary | ICD-10-CM | POA: Diagnosis not present

## 2017-02-06 DIAGNOSIS — M1991 Primary osteoarthritis, unspecified site: Secondary | ICD-10-CM | POA: Diagnosis not present

## 2017-02-09 DIAGNOSIS — M1991 Primary osteoarthritis, unspecified site: Secondary | ICD-10-CM | POA: Diagnosis not present

## 2017-02-09 DIAGNOSIS — G309 Alzheimer's disease, unspecified: Secondary | ICD-10-CM | POA: Diagnosis not present

## 2017-02-10 DIAGNOSIS — M1991 Primary osteoarthritis, unspecified site: Secondary | ICD-10-CM | POA: Diagnosis not present

## 2017-02-10 DIAGNOSIS — D649 Anemia, unspecified: Secondary | ICD-10-CM | POA: Diagnosis not present

## 2017-02-10 DIAGNOSIS — E039 Hypothyroidism, unspecified: Secondary | ICD-10-CM | POA: Diagnosis not present

## 2017-02-10 DIAGNOSIS — I10 Essential (primary) hypertension: Secondary | ICD-10-CM | POA: Diagnosis not present

## 2017-02-10 DIAGNOSIS — G309 Alzheimer's disease, unspecified: Secondary | ICD-10-CM | POA: Diagnosis not present

## 2017-02-11 DIAGNOSIS — M1991 Primary osteoarthritis, unspecified site: Secondary | ICD-10-CM | POA: Diagnosis not present

## 2017-02-11 DIAGNOSIS — E8809 Other disorders of plasma-protein metabolism, not elsewhere classified: Secondary | ICD-10-CM | POA: Diagnosis not present

## 2017-02-11 DIAGNOSIS — G309 Alzheimer's disease, unspecified: Secondary | ICD-10-CM | POA: Diagnosis not present

## 2017-02-11 DIAGNOSIS — F0280 Dementia in other diseases classified elsewhere without behavioral disturbance: Secondary | ICD-10-CM | POA: Diagnosis not present

## 2017-02-11 DIAGNOSIS — F320 Major depressive disorder, single episode, mild: Secondary | ICD-10-CM | POA: Diagnosis not present

## 2017-02-12 DIAGNOSIS — M1991 Primary osteoarthritis, unspecified site: Secondary | ICD-10-CM | POA: Diagnosis not present

## 2017-02-12 DIAGNOSIS — G309 Alzheimer's disease, unspecified: Secondary | ICD-10-CM | POA: Diagnosis not present

## 2017-02-17 DIAGNOSIS — G309 Alzheimer's disease, unspecified: Secondary | ICD-10-CM | POA: Diagnosis not present

## 2017-02-17 DIAGNOSIS — M1991 Primary osteoarthritis, unspecified site: Secondary | ICD-10-CM | POA: Diagnosis not present

## 2017-02-18 DIAGNOSIS — M1991 Primary osteoarthritis, unspecified site: Secondary | ICD-10-CM | POA: Diagnosis not present

## 2017-02-18 DIAGNOSIS — G309 Alzheimer's disease, unspecified: Secondary | ICD-10-CM | POA: Diagnosis not present

## 2017-02-19 DIAGNOSIS — G309 Alzheimer's disease, unspecified: Secondary | ICD-10-CM | POA: Diagnosis not present

## 2017-02-19 DIAGNOSIS — M1991 Primary osteoarthritis, unspecified site: Secondary | ICD-10-CM | POA: Diagnosis not present

## 2017-02-20 DIAGNOSIS — G309 Alzheimer's disease, unspecified: Secondary | ICD-10-CM | POA: Diagnosis not present

## 2017-02-20 DIAGNOSIS — M1991 Primary osteoarthritis, unspecified site: Secondary | ICD-10-CM | POA: Diagnosis not present

## 2017-02-24 DIAGNOSIS — M1991 Primary osteoarthritis, unspecified site: Secondary | ICD-10-CM | POA: Diagnosis not present

## 2017-02-24 DIAGNOSIS — G309 Alzheimer's disease, unspecified: Secondary | ICD-10-CM | POA: Diagnosis not present

## 2017-02-25 DIAGNOSIS — M1991 Primary osteoarthritis, unspecified site: Secondary | ICD-10-CM | POA: Diagnosis not present

## 2017-02-25 DIAGNOSIS — D692 Other nonthrombocytopenic purpura: Secondary | ICD-10-CM | POA: Diagnosis not present

## 2017-02-25 DIAGNOSIS — G309 Alzheimer's disease, unspecified: Secondary | ICD-10-CM | POA: Diagnosis not present

## 2017-02-25 DIAGNOSIS — F0280 Dementia in other diseases classified elsewhere without behavioral disturbance: Secondary | ICD-10-CM | POA: Diagnosis not present

## 2017-02-25 DIAGNOSIS — F418 Other specified anxiety disorders: Secondary | ICD-10-CM | POA: Diagnosis not present

## 2017-02-26 DIAGNOSIS — D649 Anemia, unspecified: Secondary | ICD-10-CM | POA: Diagnosis not present

## 2017-02-27 DIAGNOSIS — G309 Alzheimer's disease, unspecified: Secondary | ICD-10-CM | POA: Diagnosis not present

## 2017-02-27 DIAGNOSIS — M1991 Primary osteoarthritis, unspecified site: Secondary | ICD-10-CM | POA: Diagnosis not present

## 2017-03-02 DIAGNOSIS — G309 Alzheimer's disease, unspecified: Secondary | ICD-10-CM | POA: Diagnosis not present

## 2017-03-02 DIAGNOSIS — M1991 Primary osteoarthritis, unspecified site: Secondary | ICD-10-CM | POA: Diagnosis not present

## 2017-03-03 DIAGNOSIS — M1991 Primary osteoarthritis, unspecified site: Secondary | ICD-10-CM | POA: Diagnosis not present

## 2017-03-03 DIAGNOSIS — G309 Alzheimer's disease, unspecified: Secondary | ICD-10-CM | POA: Diagnosis not present

## 2017-03-05 DIAGNOSIS — M1991 Primary osteoarthritis, unspecified site: Secondary | ICD-10-CM | POA: Diagnosis not present

## 2017-03-05 DIAGNOSIS — G309 Alzheimer's disease, unspecified: Secondary | ICD-10-CM | POA: Diagnosis not present

## 2017-03-09 DIAGNOSIS — G309 Alzheimer's disease, unspecified: Secondary | ICD-10-CM | POA: Diagnosis not present

## 2017-03-09 DIAGNOSIS — M1991 Primary osteoarthritis, unspecified site: Secondary | ICD-10-CM | POA: Diagnosis not present

## 2017-03-10 DIAGNOSIS — G309 Alzheimer's disease, unspecified: Secondary | ICD-10-CM | POA: Diagnosis not present

## 2017-03-10 DIAGNOSIS — M1991 Primary osteoarthritis, unspecified site: Secondary | ICD-10-CM | POA: Diagnosis not present

## 2017-03-11 DIAGNOSIS — M1991 Primary osteoarthritis, unspecified site: Secondary | ICD-10-CM | POA: Diagnosis not present

## 2017-03-11 DIAGNOSIS — G309 Alzheimer's disease, unspecified: Secondary | ICD-10-CM | POA: Diagnosis not present

## 2017-03-13 DIAGNOSIS — M1991 Primary osteoarthritis, unspecified site: Secondary | ICD-10-CM | POA: Diagnosis not present

## 2017-03-13 DIAGNOSIS — G309 Alzheimer's disease, unspecified: Secondary | ICD-10-CM | POA: Diagnosis not present

## 2017-03-16 DIAGNOSIS — G309 Alzheimer's disease, unspecified: Secondary | ICD-10-CM | POA: Diagnosis not present

## 2017-03-16 DIAGNOSIS — M1991 Primary osteoarthritis, unspecified site: Secondary | ICD-10-CM | POA: Diagnosis not present

## 2017-03-18 DIAGNOSIS — I2510 Atherosclerotic heart disease of native coronary artery without angina pectoris: Secondary | ICD-10-CM | POA: Diagnosis not present

## 2017-03-18 DIAGNOSIS — G309 Alzheimer's disease, unspecified: Secondary | ICD-10-CM | POA: Diagnosis not present

## 2017-03-18 DIAGNOSIS — I10 Essential (primary) hypertension: Secondary | ICD-10-CM | POA: Diagnosis not present

## 2017-03-18 DIAGNOSIS — R233 Spontaneous ecchymoses: Secondary | ICD-10-CM | POA: Diagnosis not present

## 2017-03-18 DIAGNOSIS — M1991 Primary osteoarthritis, unspecified site: Secondary | ICD-10-CM | POA: Diagnosis not present

## 2017-03-18 DIAGNOSIS — F0280 Dementia in other diseases classified elsewhere without behavioral disturbance: Secondary | ICD-10-CM | POA: Diagnosis not present

## 2017-03-19 DIAGNOSIS — M1991 Primary osteoarthritis, unspecified site: Secondary | ICD-10-CM | POA: Diagnosis not present

## 2017-03-19 DIAGNOSIS — G309 Alzheimer's disease, unspecified: Secondary | ICD-10-CM | POA: Diagnosis not present

## 2017-03-23 DIAGNOSIS — M1991 Primary osteoarthritis, unspecified site: Secondary | ICD-10-CM | POA: Diagnosis not present

## 2017-03-23 DIAGNOSIS — G309 Alzheimer's disease, unspecified: Secondary | ICD-10-CM | POA: Diagnosis not present

## 2017-03-25 DIAGNOSIS — M1991 Primary osteoarthritis, unspecified site: Secondary | ICD-10-CM | POA: Diagnosis not present

## 2017-03-25 DIAGNOSIS — G309 Alzheimer's disease, unspecified: Secondary | ICD-10-CM | POA: Diagnosis not present

## 2017-04-07 DIAGNOSIS — E039 Hypothyroidism, unspecified: Secondary | ICD-10-CM | POA: Diagnosis not present

## 2017-04-08 DIAGNOSIS — G308 Other Alzheimer's disease: Secondary | ICD-10-CM | POA: Diagnosis not present

## 2017-04-08 DIAGNOSIS — F320 Major depressive disorder, single episode, mild: Secondary | ICD-10-CM | POA: Diagnosis not present

## 2017-04-08 DIAGNOSIS — F0280 Dementia in other diseases classified elsewhere without behavioral disturbance: Secondary | ICD-10-CM | POA: Diagnosis not present

## 2017-04-08 DIAGNOSIS — J984 Other disorders of lung: Secondary | ICD-10-CM | POA: Diagnosis not present

## 2017-04-24 DIAGNOSIS — S62324A Displaced fracture of shaft of fourth metacarpal bone, right hand, initial encounter for closed fracture: Secondary | ICD-10-CM | POA: Diagnosis not present

## 2017-04-26 DIAGNOSIS — S8992XA Unspecified injury of left lower leg, initial encounter: Secondary | ICD-10-CM | POA: Diagnosis not present

## 2017-04-26 DIAGNOSIS — Z79899 Other long term (current) drug therapy: Secondary | ICD-10-CM | POA: Diagnosis not present

## 2017-04-26 DIAGNOSIS — M549 Dorsalgia, unspecified: Secondary | ICD-10-CM | POA: Diagnosis not present

## 2017-04-26 DIAGNOSIS — Z681 Body mass index (BMI) 19 or less, adult: Secondary | ICD-10-CM | POA: Diagnosis not present

## 2017-04-26 DIAGNOSIS — I517 Cardiomegaly: Secondary | ICD-10-CM | POA: Diagnosis not present

## 2017-04-26 DIAGNOSIS — F0280 Dementia in other diseases classified elsewhere without behavioral disturbance: Secondary | ICD-10-CM | POA: Diagnosis not present

## 2017-04-26 DIAGNOSIS — M25562 Pain in left knee: Secondary | ICD-10-CM | POA: Diagnosis not present

## 2017-04-26 DIAGNOSIS — I700 Atherosclerosis of aorta: Secondary | ICD-10-CM | POA: Diagnosis not present

## 2017-04-26 DIAGNOSIS — Z880 Allergy status to penicillin: Secondary | ICD-10-CM | POA: Diagnosis not present

## 2017-04-26 DIAGNOSIS — Z882 Allergy status to sulfonamides status: Secondary | ICD-10-CM | POA: Diagnosis not present

## 2017-04-26 DIAGNOSIS — S8002XA Contusion of left knee, initial encounter: Secondary | ICD-10-CM | POA: Diagnosis not present

## 2017-04-26 DIAGNOSIS — S62354D Nondisplaced fracture of shaft of fourth metacarpal bone, right hand, subsequent encounter for fracture with routine healing: Secondary | ICD-10-CM | POA: Diagnosis not present

## 2017-04-26 DIAGNOSIS — S79919A Unspecified injury of unspecified hip, initial encounter: Secondary | ICD-10-CM | POA: Diagnosis not present

## 2017-04-26 DIAGNOSIS — M79641 Pain in right hand: Secondary | ICD-10-CM | POA: Diagnosis not present

## 2017-04-26 DIAGNOSIS — F0390 Unspecified dementia without behavioral disturbance: Secondary | ICD-10-CM | POA: Diagnosis not present

## 2017-04-26 DIAGNOSIS — S60221A Contusion of right hand, initial encounter: Secondary | ICD-10-CM | POA: Diagnosis not present

## 2017-04-26 DIAGNOSIS — G309 Alzheimer's disease, unspecified: Secondary | ICD-10-CM | POA: Diagnosis not present

## 2017-04-26 DIAGNOSIS — E43 Unspecified severe protein-calorie malnutrition: Secondary | ICD-10-CM | POA: Diagnosis not present

## 2017-04-26 DIAGNOSIS — Z9181 History of falling: Secondary | ICD-10-CM | POA: Diagnosis not present

## 2017-04-26 DIAGNOSIS — M545 Low back pain: Secondary | ICD-10-CM | POA: Diagnosis not present

## 2017-04-26 DIAGNOSIS — R262 Difficulty in walking, not elsewhere classified: Secondary | ICD-10-CM | POA: Diagnosis not present

## 2017-04-26 DIAGNOSIS — M25551 Pain in right hip: Secondary | ICD-10-CM | POA: Diagnosis not present

## 2017-04-26 DIAGNOSIS — S79911A Unspecified injury of right hip, initial encounter: Secondary | ICD-10-CM | POA: Diagnosis not present

## 2017-04-26 DIAGNOSIS — Z853 Personal history of malignant neoplasm of breast: Secondary | ICD-10-CM | POA: Diagnosis not present

## 2017-04-26 DIAGNOSIS — S299XXA Unspecified injury of thorax, initial encounter: Secondary | ICD-10-CM | POA: Diagnosis not present

## 2017-04-26 DIAGNOSIS — S62324D Displaced fracture of shaft of fourth metacarpal bone, right hand, subsequent encounter for fracture with routine healing: Secondary | ICD-10-CM | POA: Diagnosis not present

## 2017-04-26 DIAGNOSIS — G8911 Acute pain due to trauma: Secondary | ICD-10-CM | POA: Diagnosis not present

## 2017-04-26 DIAGNOSIS — Z7982 Long term (current) use of aspirin: Secondary | ICD-10-CM | POA: Diagnosis not present

## 2017-04-26 DIAGNOSIS — M8580 Other specified disorders of bone density and structure, unspecified site: Secondary | ICD-10-CM | POA: Diagnosis not present

## 2017-04-26 DIAGNOSIS — S62325A Displaced fracture of shaft of fourth metacarpal bone, left hand, initial encounter for closed fracture: Secondary | ICD-10-CM | POA: Diagnosis not present

## 2017-04-26 DIAGNOSIS — M5136 Other intervertebral disc degeneration, lumbar region: Secondary | ICD-10-CM | POA: Diagnosis not present

## 2017-04-27 DIAGNOSIS — G301 Alzheimer's disease with late onset: Secondary | ICD-10-CM | POA: Diagnosis not present

## 2017-04-27 DIAGNOSIS — W19XXXA Unspecified fall, initial encounter: Secondary | ICD-10-CM | POA: Diagnosis not present

## 2017-04-27 DIAGNOSIS — S62324D Displaced fracture of shaft of fourth metacarpal bone, right hand, subsequent encounter for fracture with routine healing: Secondary | ICD-10-CM | POA: Diagnosis not present

## 2017-04-27 DIAGNOSIS — F0280 Dementia in other diseases classified elsewhere without behavioral disturbance: Secondary | ICD-10-CM | POA: Diagnosis not present

## 2017-04-28 DIAGNOSIS — W19XXXA Unspecified fall, initial encounter: Secondary | ICD-10-CM | POA: Diagnosis not present

## 2017-04-28 DIAGNOSIS — S62324D Displaced fracture of shaft of fourth metacarpal bone, right hand, subsequent encounter for fracture with routine healing: Secondary | ICD-10-CM | POA: Diagnosis not present

## 2017-04-28 DIAGNOSIS — G301 Alzheimer's disease with late onset: Secondary | ICD-10-CM | POA: Diagnosis not present

## 2017-04-29 DIAGNOSIS — W19XXXA Unspecified fall, initial encounter: Secondary | ICD-10-CM | POA: Diagnosis not present

## 2017-04-29 DIAGNOSIS — G301 Alzheimer's disease with late onset: Secondary | ICD-10-CM | POA: Diagnosis not present

## 2017-04-29 DIAGNOSIS — T07XXXA Unspecified multiple injuries, initial encounter: Secondary | ICD-10-CM | POA: Diagnosis not present

## 2017-04-30 DIAGNOSIS — M6281 Muscle weakness (generalized): Secondary | ICD-10-CM | POA: Diagnosis not present

## 2017-04-30 DIAGNOSIS — M25541 Pain in joints of right hand: Secondary | ICD-10-CM | POA: Diagnosis not present

## 2017-05-01 DIAGNOSIS — M25541 Pain in joints of right hand: Secondary | ICD-10-CM | POA: Diagnosis not present

## 2017-05-01 DIAGNOSIS — E559 Vitamin D deficiency, unspecified: Secondary | ICD-10-CM | POA: Diagnosis not present

## 2017-05-01 DIAGNOSIS — M6281 Muscle weakness (generalized): Secondary | ICD-10-CM | POA: Diagnosis not present

## 2017-05-01 DIAGNOSIS — G308 Other Alzheimer's disease: Secondary | ICD-10-CM | POA: Diagnosis not present

## 2017-05-01 DIAGNOSIS — M545 Low back pain: Secondary | ICD-10-CM | POA: Diagnosis not present

## 2017-05-01 DIAGNOSIS — R638 Other symptoms and signs concerning food and fluid intake: Secondary | ICD-10-CM | POA: Diagnosis not present

## 2017-05-01 DIAGNOSIS — Z418 Encounter for other procedures for purposes other than remedying health state: Secondary | ICD-10-CM | POA: Diagnosis not present

## 2017-05-01 DIAGNOSIS — F0280 Dementia in other diseases classified elsewhere without behavioral disturbance: Secondary | ICD-10-CM | POA: Diagnosis not present

## 2017-05-04 DIAGNOSIS — M6281 Muscle weakness (generalized): Secondary | ICD-10-CM | POA: Diagnosis not present

## 2017-05-04 DIAGNOSIS — M25541 Pain in joints of right hand: Secondary | ICD-10-CM | POA: Diagnosis not present

## 2017-05-05 DIAGNOSIS — M6281 Muscle weakness (generalized): Secondary | ICD-10-CM | POA: Diagnosis not present

## 2017-05-05 DIAGNOSIS — M25541 Pain in joints of right hand: Secondary | ICD-10-CM | POA: Diagnosis not present

## 2017-05-06 DIAGNOSIS — M6281 Muscle weakness (generalized): Secondary | ICD-10-CM | POA: Diagnosis not present

## 2017-05-06 DIAGNOSIS — M25541 Pain in joints of right hand: Secondary | ICD-10-CM | POA: Diagnosis not present

## 2017-05-07 DIAGNOSIS — M6281 Muscle weakness (generalized): Secondary | ICD-10-CM | POA: Diagnosis not present

## 2017-05-07 DIAGNOSIS — M25541 Pain in joints of right hand: Secondary | ICD-10-CM | POA: Diagnosis not present

## 2017-05-08 DIAGNOSIS — M1388 Other specified arthritis, other site: Secondary | ICD-10-CM | POA: Diagnosis not present

## 2017-05-08 DIAGNOSIS — R1084 Generalized abdominal pain: Secondary | ICD-10-CM | POA: Diagnosis not present

## 2017-05-08 DIAGNOSIS — F0280 Dementia in other diseases classified elsewhere without behavioral disturbance: Secondary | ICD-10-CM | POA: Diagnosis not present

## 2017-05-08 DIAGNOSIS — G308 Other Alzheimer's disease: Secondary | ICD-10-CM | POA: Diagnosis not present

## 2017-05-08 DIAGNOSIS — M25541 Pain in joints of right hand: Secondary | ICD-10-CM | POA: Diagnosis not present

## 2017-05-08 DIAGNOSIS — M6281 Muscle weakness (generalized): Secondary | ICD-10-CM | POA: Diagnosis not present

## 2017-05-09 DIAGNOSIS — F0281 Dementia in other diseases classified elsewhere with behavioral disturbance: Secondary | ICD-10-CM | POA: Diagnosis not present

## 2017-05-09 DIAGNOSIS — G309 Alzheimer's disease, unspecified: Secondary | ICD-10-CM | POA: Diagnosis not present

## 2017-05-09 DIAGNOSIS — K5900 Constipation, unspecified: Secondary | ICD-10-CM | POA: Diagnosis not present

## 2017-05-09 DIAGNOSIS — F0390 Unspecified dementia without behavioral disturbance: Secondary | ICD-10-CM | POA: Diagnosis not present

## 2017-05-09 DIAGNOSIS — R031 Nonspecific low blood-pressure reading: Secondary | ICD-10-CM | POA: Diagnosis not present

## 2017-05-09 DIAGNOSIS — S39012A Strain of muscle, fascia and tendon of lower back, initial encounter: Secondary | ICD-10-CM | POA: Diagnosis not present

## 2017-05-09 DIAGNOSIS — Z7982 Long term (current) use of aspirin: Secondary | ICD-10-CM | POA: Diagnosis not present

## 2017-05-09 DIAGNOSIS — M25552 Pain in left hip: Secondary | ICD-10-CM | POA: Diagnosis not present

## 2017-05-11 DIAGNOSIS — M25541 Pain in joints of right hand: Secondary | ICD-10-CM | POA: Diagnosis not present

## 2017-05-11 DIAGNOSIS — M6281 Muscle weakness (generalized): Secondary | ICD-10-CM | POA: Diagnosis not present

## 2017-05-12 DIAGNOSIS — M6281 Muscle weakness (generalized): Secondary | ICD-10-CM | POA: Diagnosis not present

## 2017-05-12 DIAGNOSIS — M25541 Pain in joints of right hand: Secondary | ICD-10-CM | POA: Diagnosis not present

## 2017-05-13 DIAGNOSIS — M6281 Muscle weakness (generalized): Secondary | ICD-10-CM | POA: Diagnosis not present

## 2017-05-13 DIAGNOSIS — M25541 Pain in joints of right hand: Secondary | ICD-10-CM | POA: Diagnosis not present

## 2017-05-14 DIAGNOSIS — M6281 Muscle weakness (generalized): Secondary | ICD-10-CM | POA: Diagnosis not present

## 2017-05-14 DIAGNOSIS — M25541 Pain in joints of right hand: Secondary | ICD-10-CM | POA: Diagnosis not present

## 2017-05-15 DIAGNOSIS — M25541 Pain in joints of right hand: Secondary | ICD-10-CM | POA: Diagnosis not present

## 2017-05-15 DIAGNOSIS — M6281 Muscle weakness (generalized): Secondary | ICD-10-CM | POA: Diagnosis not present

## 2017-05-15 DIAGNOSIS — M1388 Other specified arthritis, other site: Secondary | ICD-10-CM | POA: Diagnosis not present

## 2017-05-15 DIAGNOSIS — K5909 Other constipation: Secondary | ICD-10-CM | POA: Diagnosis not present

## 2017-05-15 DIAGNOSIS — F0280 Dementia in other diseases classified elsewhere without behavioral disturbance: Secondary | ICD-10-CM | POA: Diagnosis not present

## 2017-05-15 DIAGNOSIS — G308 Other Alzheimer's disease: Secondary | ICD-10-CM | POA: Diagnosis not present

## 2017-05-18 DIAGNOSIS — M6281 Muscle weakness (generalized): Secondary | ICD-10-CM | POA: Diagnosis not present

## 2017-05-18 DIAGNOSIS — M25541 Pain in joints of right hand: Secondary | ICD-10-CM | POA: Diagnosis not present

## 2017-05-19 DIAGNOSIS — M6281 Muscle weakness (generalized): Secondary | ICD-10-CM | POA: Diagnosis not present

## 2017-05-19 DIAGNOSIS — M25541 Pain in joints of right hand: Secondary | ICD-10-CM | POA: Diagnosis not present

## 2017-05-20 DIAGNOSIS — M6281 Muscle weakness (generalized): Secondary | ICD-10-CM | POA: Diagnosis not present

## 2017-05-20 DIAGNOSIS — M25541 Pain in joints of right hand: Secondary | ICD-10-CM | POA: Diagnosis not present

## 2017-05-21 DIAGNOSIS — M25541 Pain in joints of right hand: Secondary | ICD-10-CM | POA: Diagnosis not present

## 2017-05-21 DIAGNOSIS — M6281 Muscle weakness (generalized): Secondary | ICD-10-CM | POA: Diagnosis not present

## 2017-05-23 DIAGNOSIS — M6281 Muscle weakness (generalized): Secondary | ICD-10-CM | POA: Diagnosis not present

## 2017-05-23 DIAGNOSIS — M25541 Pain in joints of right hand: Secondary | ICD-10-CM | POA: Diagnosis not present

## 2017-05-25 DIAGNOSIS — M6281 Muscle weakness (generalized): Secondary | ICD-10-CM | POA: Diagnosis not present

## 2017-05-25 DIAGNOSIS — M25541 Pain in joints of right hand: Secondary | ICD-10-CM | POA: Diagnosis not present

## 2017-05-26 DIAGNOSIS — M25541 Pain in joints of right hand: Secondary | ICD-10-CM | POA: Diagnosis not present

## 2017-05-26 DIAGNOSIS — M6281 Muscle weakness (generalized): Secondary | ICD-10-CM | POA: Diagnosis not present

## 2017-05-27 DIAGNOSIS — M25541 Pain in joints of right hand: Secondary | ICD-10-CM | POA: Diagnosis not present

## 2017-05-27 DIAGNOSIS — M6281 Muscle weakness (generalized): Secondary | ICD-10-CM | POA: Diagnosis not present

## 2017-05-28 DIAGNOSIS — M25541 Pain in joints of right hand: Secondary | ICD-10-CM | POA: Diagnosis not present

## 2017-05-28 DIAGNOSIS — M6281 Muscle weakness (generalized): Secondary | ICD-10-CM | POA: Diagnosis not present

## 2017-06-03 DIAGNOSIS — S62304D Unspecified fracture of fourth metacarpal bone, right hand, subsequent encounter for fracture with routine healing: Secondary | ICD-10-CM | POA: Diagnosis not present

## 2017-06-03 DIAGNOSIS — L989 Disorder of the skin and subcutaneous tissue, unspecified: Secondary | ICD-10-CM | POA: Diagnosis not present

## 2017-06-04 DIAGNOSIS — L989 Disorder of the skin and subcutaneous tissue, unspecified: Secondary | ICD-10-CM | POA: Diagnosis not present

## 2017-06-04 DIAGNOSIS — S62304D Unspecified fracture of fourth metacarpal bone, right hand, subsequent encounter for fracture with routine healing: Secondary | ICD-10-CM | POA: Diagnosis not present

## 2017-06-05 DIAGNOSIS — S62304D Unspecified fracture of fourth metacarpal bone, right hand, subsequent encounter for fracture with routine healing: Secondary | ICD-10-CM | POA: Diagnosis not present

## 2017-06-05 DIAGNOSIS — L989 Disorder of the skin and subcutaneous tissue, unspecified: Secondary | ICD-10-CM | POA: Diagnosis not present

## 2017-06-08 DIAGNOSIS — L989 Disorder of the skin and subcutaneous tissue, unspecified: Secondary | ICD-10-CM | POA: Diagnosis not present

## 2017-06-08 DIAGNOSIS — S62304D Unspecified fracture of fourth metacarpal bone, right hand, subsequent encounter for fracture with routine healing: Secondary | ICD-10-CM | POA: Diagnosis not present

## 2017-06-11 DIAGNOSIS — S62304D Unspecified fracture of fourth metacarpal bone, right hand, subsequent encounter for fracture with routine healing: Secondary | ICD-10-CM | POA: Diagnosis not present

## 2017-06-11 DIAGNOSIS — L989 Disorder of the skin and subcutaneous tissue, unspecified: Secondary | ICD-10-CM | POA: Diagnosis not present

## 2017-06-12 DIAGNOSIS — S62304D Unspecified fracture of fourth metacarpal bone, right hand, subsequent encounter for fracture with routine healing: Secondary | ICD-10-CM | POA: Diagnosis not present

## 2017-06-12 DIAGNOSIS — L989 Disorder of the skin and subcutaneous tissue, unspecified: Secondary | ICD-10-CM | POA: Diagnosis not present

## 2017-06-16 DIAGNOSIS — L989 Disorder of the skin and subcutaneous tissue, unspecified: Secondary | ICD-10-CM | POA: Diagnosis not present

## 2017-06-16 DIAGNOSIS — S62304D Unspecified fracture of fourth metacarpal bone, right hand, subsequent encounter for fracture with routine healing: Secondary | ICD-10-CM | POA: Diagnosis not present

## 2017-06-17 DIAGNOSIS — S81802A Unspecified open wound, left lower leg, initial encounter: Secondary | ICD-10-CM | POA: Diagnosis not present

## 2017-06-17 DIAGNOSIS — L03116 Cellulitis of left lower limb: Secondary | ICD-10-CM | POA: Diagnosis not present

## 2017-06-18 DIAGNOSIS — L989 Disorder of the skin and subcutaneous tissue, unspecified: Secondary | ICD-10-CM | POA: Diagnosis not present

## 2017-06-18 DIAGNOSIS — S62304D Unspecified fracture of fourth metacarpal bone, right hand, subsequent encounter for fracture with routine healing: Secondary | ICD-10-CM | POA: Diagnosis not present

## 2017-06-22 DIAGNOSIS — L989 Disorder of the skin and subcutaneous tissue, unspecified: Secondary | ICD-10-CM | POA: Diagnosis not present

## 2017-06-22 DIAGNOSIS — S62304D Unspecified fracture of fourth metacarpal bone, right hand, subsequent encounter for fracture with routine healing: Secondary | ICD-10-CM | POA: Diagnosis not present

## 2017-06-25 DIAGNOSIS — L989 Disorder of the skin and subcutaneous tissue, unspecified: Secondary | ICD-10-CM | POA: Diagnosis not present

## 2017-06-25 DIAGNOSIS — S62304D Unspecified fracture of fourth metacarpal bone, right hand, subsequent encounter for fracture with routine healing: Secondary | ICD-10-CM | POA: Diagnosis not present

## 2017-06-27 DIAGNOSIS — L989 Disorder of the skin and subcutaneous tissue, unspecified: Secondary | ICD-10-CM | POA: Diagnosis not present

## 2017-06-27 DIAGNOSIS — S62304D Unspecified fracture of fourth metacarpal bone, right hand, subsequent encounter for fracture with routine healing: Secondary | ICD-10-CM | POA: Diagnosis not present

## 2017-06-29 DIAGNOSIS — S62304D Unspecified fracture of fourth metacarpal bone, right hand, subsequent encounter for fracture with routine healing: Secondary | ICD-10-CM | POA: Diagnosis not present

## 2017-06-29 DIAGNOSIS — L989 Disorder of the skin and subcutaneous tissue, unspecified: Secondary | ICD-10-CM | POA: Diagnosis not present

## 2017-07-01 DIAGNOSIS — R233 Spontaneous ecchymoses: Secondary | ICD-10-CM | POA: Diagnosis not present

## 2017-07-02 DIAGNOSIS — L989 Disorder of the skin and subcutaneous tissue, unspecified: Secondary | ICD-10-CM | POA: Diagnosis not present

## 2017-07-02 DIAGNOSIS — S62304D Unspecified fracture of fourth metacarpal bone, right hand, subsequent encounter for fracture with routine healing: Secondary | ICD-10-CM | POA: Diagnosis not present

## 2017-07-07 DIAGNOSIS — S62304D Unspecified fracture of fourth metacarpal bone, right hand, subsequent encounter for fracture with routine healing: Secondary | ICD-10-CM | POA: Diagnosis not present

## 2017-07-07 DIAGNOSIS — L989 Disorder of the skin and subcutaneous tissue, unspecified: Secondary | ICD-10-CM | POA: Diagnosis not present

## 2017-07-08 DIAGNOSIS — K5909 Other constipation: Secondary | ICD-10-CM | POA: Diagnosis not present

## 2017-07-08 DIAGNOSIS — M62838 Other muscle spasm: Secondary | ICD-10-CM | POA: Diagnosis not present

## 2017-07-08 DIAGNOSIS — F0280 Dementia in other diseases classified elsewhere without behavioral disturbance: Secondary | ICD-10-CM | POA: Diagnosis not present

## 2017-07-08 DIAGNOSIS — S62304D Unspecified fracture of fourth metacarpal bone, right hand, subsequent encounter for fracture with routine healing: Secondary | ICD-10-CM | POA: Diagnosis not present

## 2017-07-08 DIAGNOSIS — R600 Localized edema: Secondary | ICD-10-CM | POA: Diagnosis not present

## 2017-07-08 DIAGNOSIS — L989 Disorder of the skin and subcutaneous tissue, unspecified: Secondary | ICD-10-CM | POA: Diagnosis not present

## 2017-07-10 DIAGNOSIS — S62304D Unspecified fracture of fourth metacarpal bone, right hand, subsequent encounter for fracture with routine healing: Secondary | ICD-10-CM | POA: Diagnosis not present

## 2017-07-10 DIAGNOSIS — L989 Disorder of the skin and subcutaneous tissue, unspecified: Secondary | ICD-10-CM | POA: Diagnosis not present

## 2017-07-14 DIAGNOSIS — I10 Essential (primary) hypertension: Secondary | ICD-10-CM | POA: Diagnosis not present

## 2017-07-14 DIAGNOSIS — D649 Anemia, unspecified: Secondary | ICD-10-CM | POA: Diagnosis not present

## 2017-07-14 DIAGNOSIS — L989 Disorder of the skin and subcutaneous tissue, unspecified: Secondary | ICD-10-CM | POA: Diagnosis not present

## 2017-07-14 DIAGNOSIS — S62304D Unspecified fracture of fourth metacarpal bone, right hand, subsequent encounter for fracture with routine healing: Secondary | ICD-10-CM | POA: Diagnosis not present

## 2017-07-17 DIAGNOSIS — L989 Disorder of the skin and subcutaneous tissue, unspecified: Secondary | ICD-10-CM | POA: Diagnosis not present

## 2017-07-17 DIAGNOSIS — S62304D Unspecified fracture of fourth metacarpal bone, right hand, subsequent encounter for fracture with routine healing: Secondary | ICD-10-CM | POA: Diagnosis not present

## 2017-07-20 DIAGNOSIS — L989 Disorder of the skin and subcutaneous tissue, unspecified: Secondary | ICD-10-CM | POA: Diagnosis not present

## 2017-07-20 DIAGNOSIS — S62304D Unspecified fracture of fourth metacarpal bone, right hand, subsequent encounter for fracture with routine healing: Secondary | ICD-10-CM | POA: Diagnosis not present

## 2017-07-22 DIAGNOSIS — L03115 Cellulitis of right lower limb: Secondary | ICD-10-CM | POA: Diagnosis not present

## 2017-07-22 DIAGNOSIS — G308 Other Alzheimer's disease: Secondary | ICD-10-CM | POA: Diagnosis not present

## 2017-07-22 DIAGNOSIS — F0280 Dementia in other diseases classified elsewhere without behavioral disturbance: Secondary | ICD-10-CM | POA: Diagnosis not present

## 2017-07-23 DIAGNOSIS — L989 Disorder of the skin and subcutaneous tissue, unspecified: Secondary | ICD-10-CM | POA: Diagnosis not present

## 2017-07-23 DIAGNOSIS — S62304D Unspecified fracture of fourth metacarpal bone, right hand, subsequent encounter for fracture with routine healing: Secondary | ICD-10-CM | POA: Diagnosis not present

## 2017-07-27 DIAGNOSIS — L989 Disorder of the skin and subcutaneous tissue, unspecified: Secondary | ICD-10-CM | POA: Diagnosis not present

## 2017-07-27 DIAGNOSIS — S62304D Unspecified fracture of fourth metacarpal bone, right hand, subsequent encounter for fracture with routine healing: Secondary | ICD-10-CM | POA: Diagnosis not present

## 2017-07-29 DIAGNOSIS — L989 Disorder of the skin and subcutaneous tissue, unspecified: Secondary | ICD-10-CM | POA: Diagnosis not present

## 2017-07-29 DIAGNOSIS — S62304D Unspecified fracture of fourth metacarpal bone, right hand, subsequent encounter for fracture with routine healing: Secondary | ICD-10-CM | POA: Diagnosis not present

## 2017-07-29 DIAGNOSIS — L98499 Non-pressure chronic ulcer of skin of other sites with unspecified severity: Secondary | ICD-10-CM | POA: Diagnosis not present

## 2017-07-29 DIAGNOSIS — L03115 Cellulitis of right lower limb: Secondary | ICD-10-CM | POA: Diagnosis not present

## 2017-07-30 DIAGNOSIS — L989 Disorder of the skin and subcutaneous tissue, unspecified: Secondary | ICD-10-CM | POA: Diagnosis not present

## 2017-07-30 DIAGNOSIS — S62304D Unspecified fracture of fourth metacarpal bone, right hand, subsequent encounter for fracture with routine healing: Secondary | ICD-10-CM | POA: Diagnosis not present

## 2017-07-31 DIAGNOSIS — L989 Disorder of the skin and subcutaneous tissue, unspecified: Secondary | ICD-10-CM | POA: Diagnosis not present

## 2017-07-31 DIAGNOSIS — S62304D Unspecified fracture of fourth metacarpal bone, right hand, subsequent encounter for fracture with routine healing: Secondary | ICD-10-CM | POA: Diagnosis not present

## 2017-08-02 DIAGNOSIS — S80821A Blister (nonthermal), right lower leg, initial encounter: Secondary | ICD-10-CM | POA: Diagnosis not present

## 2017-08-02 DIAGNOSIS — L03115 Cellulitis of right lower limb: Secondary | ICD-10-CM | POA: Diagnosis not present

## 2017-08-03 DIAGNOSIS — L03115 Cellulitis of right lower limb: Secondary | ICD-10-CM | POA: Diagnosis not present

## 2017-08-03 DIAGNOSIS — S80821A Blister (nonthermal), right lower leg, initial encounter: Secondary | ICD-10-CM | POA: Diagnosis not present

## 2017-08-05 DIAGNOSIS — S80821A Blister (nonthermal), right lower leg, initial encounter: Secondary | ICD-10-CM | POA: Diagnosis not present

## 2017-08-05 DIAGNOSIS — L03115 Cellulitis of right lower limb: Secondary | ICD-10-CM | POA: Diagnosis not present

## 2017-08-07 DIAGNOSIS — S80821A Blister (nonthermal), right lower leg, initial encounter: Secondary | ICD-10-CM | POA: Diagnosis not present

## 2017-08-07 DIAGNOSIS — L03115 Cellulitis of right lower limb: Secondary | ICD-10-CM | POA: Diagnosis not present

## 2017-08-10 DIAGNOSIS — S80821A Blister (nonthermal), right lower leg, initial encounter: Secondary | ICD-10-CM | POA: Diagnosis not present

## 2017-08-10 DIAGNOSIS — L03115 Cellulitis of right lower limb: Secondary | ICD-10-CM | POA: Diagnosis not present

## 2017-08-12 DIAGNOSIS — L03115 Cellulitis of right lower limb: Secondary | ICD-10-CM | POA: Diagnosis not present

## 2017-08-12 DIAGNOSIS — S80821A Blister (nonthermal), right lower leg, initial encounter: Secondary | ICD-10-CM | POA: Diagnosis not present

## 2017-08-14 DIAGNOSIS — L03115 Cellulitis of right lower limb: Secondary | ICD-10-CM | POA: Diagnosis not present

## 2017-08-14 DIAGNOSIS — S80821A Blister (nonthermal), right lower leg, initial encounter: Secondary | ICD-10-CM | POA: Diagnosis not present

## 2017-08-14 DIAGNOSIS — Z23 Encounter for immunization: Secondary | ICD-10-CM | POA: Diagnosis not present

## 2017-08-17 DIAGNOSIS — S80821A Blister (nonthermal), right lower leg, initial encounter: Secondary | ICD-10-CM | POA: Diagnosis not present

## 2017-08-17 DIAGNOSIS — L03115 Cellulitis of right lower limb: Secondary | ICD-10-CM | POA: Diagnosis not present

## 2017-08-19 DIAGNOSIS — L03115 Cellulitis of right lower limb: Secondary | ICD-10-CM | POA: Diagnosis not present

## 2017-08-19 DIAGNOSIS — S80821A Blister (nonthermal), right lower leg, initial encounter: Secondary | ICD-10-CM | POA: Diagnosis not present

## 2017-08-21 DIAGNOSIS — L03115 Cellulitis of right lower limb: Secondary | ICD-10-CM | POA: Diagnosis not present

## 2017-08-21 DIAGNOSIS — S80821A Blister (nonthermal), right lower leg, initial encounter: Secondary | ICD-10-CM | POA: Diagnosis not present

## 2017-08-24 DIAGNOSIS — S80821A Blister (nonthermal), right lower leg, initial encounter: Secondary | ICD-10-CM | POA: Diagnosis not present

## 2017-08-24 DIAGNOSIS — L03115 Cellulitis of right lower limb: Secondary | ICD-10-CM | POA: Diagnosis not present

## 2017-08-26 DIAGNOSIS — S80821A Blister (nonthermal), right lower leg, initial encounter: Secondary | ICD-10-CM | POA: Diagnosis not present

## 2017-08-26 DIAGNOSIS — L03115 Cellulitis of right lower limb: Secondary | ICD-10-CM | POA: Diagnosis not present

## 2017-08-26 DIAGNOSIS — S81802D Unspecified open wound, left lower leg, subsequent encounter: Secondary | ICD-10-CM | POA: Diagnosis not present

## 2017-08-26 DIAGNOSIS — F0280 Dementia in other diseases classified elsewhere without behavioral disturbance: Secondary | ICD-10-CM | POA: Diagnosis not present

## 2017-08-26 DIAGNOSIS — G308 Other Alzheimer's disease: Secondary | ICD-10-CM | POA: Diagnosis not present

## 2017-08-28 DIAGNOSIS — S80821A Blister (nonthermal), right lower leg, initial encounter: Secondary | ICD-10-CM | POA: Diagnosis not present

## 2017-08-28 DIAGNOSIS — L03115 Cellulitis of right lower limb: Secondary | ICD-10-CM | POA: Diagnosis not present

## 2017-08-31 DIAGNOSIS — L03115 Cellulitis of right lower limb: Secondary | ICD-10-CM | POA: Diagnosis not present

## 2017-08-31 DIAGNOSIS — S80821A Blister (nonthermal), right lower leg, initial encounter: Secondary | ICD-10-CM | POA: Diagnosis not present

## 2017-09-02 DIAGNOSIS — S80821A Blister (nonthermal), right lower leg, initial encounter: Secondary | ICD-10-CM | POA: Diagnosis not present

## 2017-09-02 DIAGNOSIS — L03115 Cellulitis of right lower limb: Secondary | ICD-10-CM | POA: Diagnosis not present

## 2017-09-04 DIAGNOSIS — L03115 Cellulitis of right lower limb: Secondary | ICD-10-CM | POA: Diagnosis not present

## 2017-09-04 DIAGNOSIS — S80821A Blister (nonthermal), right lower leg, initial encounter: Secondary | ICD-10-CM | POA: Diagnosis not present

## 2017-09-08 DIAGNOSIS — S80821A Blister (nonthermal), right lower leg, initial encounter: Secondary | ICD-10-CM | POA: Diagnosis not present

## 2017-09-08 DIAGNOSIS — L03115 Cellulitis of right lower limb: Secondary | ICD-10-CM | POA: Diagnosis not present

## 2017-09-09 DIAGNOSIS — I517 Cardiomegaly: Secondary | ICD-10-CM | POA: Diagnosis not present

## 2017-09-09 DIAGNOSIS — L03115 Cellulitis of right lower limb: Secondary | ICD-10-CM | POA: Diagnosis not present

## 2017-09-09 DIAGNOSIS — R0989 Other specified symptoms and signs involving the circulatory and respiratory systems: Secondary | ICD-10-CM | POA: Diagnosis not present

## 2017-09-09 DIAGNOSIS — S80821A Blister (nonthermal), right lower leg, initial encounter: Secondary | ICD-10-CM | POA: Diagnosis not present

## 2017-09-10 DIAGNOSIS — I10 Essential (primary) hypertension: Secondary | ICD-10-CM | POA: Diagnosis not present

## 2017-09-11 DIAGNOSIS — L03115 Cellulitis of right lower limb: Secondary | ICD-10-CM | POA: Diagnosis not present

## 2017-09-11 DIAGNOSIS — S80821A Blister (nonthermal), right lower leg, initial encounter: Secondary | ICD-10-CM | POA: Diagnosis not present

## 2017-09-14 DIAGNOSIS — L03115 Cellulitis of right lower limb: Secondary | ICD-10-CM | POA: Diagnosis not present

## 2017-09-14 DIAGNOSIS — S80821A Blister (nonthermal), right lower leg, initial encounter: Secondary | ICD-10-CM | POA: Diagnosis not present

## 2017-09-18 DIAGNOSIS — L03115 Cellulitis of right lower limb: Secondary | ICD-10-CM | POA: Diagnosis not present

## 2017-09-18 DIAGNOSIS — S80821A Blister (nonthermal), right lower leg, initial encounter: Secondary | ICD-10-CM | POA: Diagnosis not present

## 2018-03-29 DEATH — deceased
